# Patient Record
Sex: Male | Born: 1950
Health system: Southern US, Community
[De-identification: ages and names within clinical notes are randomized; demographics above are authoritative.]

## PROBLEM LIST (undated history)

## (undated) DIAGNOSIS — G4730 Sleep apnea, unspecified: Secondary | ICD-10-CM

## (undated) DIAGNOSIS — Z85828 Personal history of other malignant neoplasm of skin: Secondary | ICD-10-CM

## (undated) DIAGNOSIS — Z8719 Personal history of other diseases of the digestive system: Secondary | ICD-10-CM

## (undated) DIAGNOSIS — E785 Hyperlipidemia, unspecified: Secondary | ICD-10-CM

## (undated) DIAGNOSIS — T8859XA Other complications of anesthesia, initial encounter: Secondary | ICD-10-CM

## (undated) DIAGNOSIS — M549 Dorsalgia, unspecified: Secondary | ICD-10-CM

## (undated) DIAGNOSIS — T4145XA Adverse effect of unspecified anesthetic, initial encounter: Secondary | ICD-10-CM

## (undated) DIAGNOSIS — L409 Psoriasis, unspecified: Secondary | ICD-10-CM

## (undated) DIAGNOSIS — Z8711 Personal history of peptic ulcer disease: Secondary | ICD-10-CM

## (undated) DIAGNOSIS — K5792 Diverticulitis of intestine, part unspecified, without perforation or abscess without bleeding: Secondary | ICD-10-CM

## (undated) DIAGNOSIS — K219 Gastro-esophageal reflux disease without esophagitis: Secondary | ICD-10-CM

## (undated) DIAGNOSIS — C801 Malignant (primary) neoplasm, unspecified: Secondary | ICD-10-CM

## (undated) DIAGNOSIS — Z8739 Personal history of other diseases of the musculoskeletal system and connective tissue: Secondary | ICD-10-CM

## (undated) DIAGNOSIS — H8109 Meniere's disease, unspecified ear: Secondary | ICD-10-CM

## (undated) HISTORY — PX: SKIN CANCER EXCISION: SHX779

---

## 1972-03-30 HISTORY — PX: FRACTURE SURGERY: SHX138

## 2001-03-30 HISTORY — PX: HERNIA REPAIR: SHX51

## 2001-05-16 ENCOUNTER — Ambulatory Visit (HOSPITAL_BASED_OUTPATIENT_CLINIC_OR_DEPARTMENT_OTHER): Admission: RE | Admit: 2001-05-16 | Discharge: 2001-05-16 | Payer: Self-pay | Admitting: Family Medicine

## 2003-03-31 HISTORY — PX: COLON SURGERY: SHX602

## 2003-11-16 ENCOUNTER — Ambulatory Visit (HOSPITAL_COMMUNITY): Admission: RE | Admit: 2003-11-16 | Discharge: 2003-11-16 | Payer: Self-pay | Admitting: Orthopedic Surgery

## 2003-11-26 ENCOUNTER — Emergency Department (HOSPITAL_COMMUNITY): Admission: EM | Admit: 2003-11-26 | Discharge: 2003-11-26 | Payer: Self-pay | Admitting: Family Medicine

## 2003-11-29 ENCOUNTER — Ambulatory Visit (HOSPITAL_COMMUNITY): Admission: RE | Admit: 2003-11-29 | Discharge: 2003-11-29 | Payer: Self-pay | Admitting: Unknown Physician Specialty

## 2004-11-24 ENCOUNTER — Encounter: Admission: RE | Admit: 2004-11-24 | Discharge: 2005-01-12 | Payer: Self-pay | Admitting: Otolaryngology

## 2005-11-06 ENCOUNTER — Encounter: Admission: RE | Admit: 2005-11-06 | Discharge: 2005-11-06 | Payer: Self-pay | Admitting: Family Medicine

## 2013-09-06 ENCOUNTER — Other Ambulatory Visit: Payer: Self-pay | Admitting: Orthopedic Surgery

## 2013-09-06 DIAGNOSIS — M4800 Spinal stenosis, site unspecified: Secondary | ICD-10-CM

## 2013-09-14 ENCOUNTER — Other Ambulatory Visit: Payer: Self-pay | Admitting: Orthopedic Surgery

## 2013-09-14 ENCOUNTER — Ambulatory Visit
Admission: RE | Admit: 2013-09-14 | Discharge: 2013-09-14 | Disposition: A | Discharge status: Home / Self Care | Payer: 59 | Source: Ambulatory Visit | Attending: Orthopedic Surgery | Admitting: Orthopedic Surgery

## 2013-09-14 ENCOUNTER — Ambulatory Visit
Admission: RE | Admit: 2013-09-14 | Discharge: 2013-09-14 | Disposition: A | Discharge status: Home / Self Care | Payer: Self-pay | Source: Ambulatory Visit | Attending: Orthopedic Surgery | Admitting: Orthopedic Surgery

## 2013-09-14 VITALS — BP 119/60 | HR 63

## 2013-09-14 DIAGNOSIS — M5126 Other intervertebral disc displacement, lumbar region: Secondary | ICD-10-CM

## 2013-09-14 DIAGNOSIS — M4800 Spinal stenosis, site unspecified: Secondary | ICD-10-CM

## 2013-09-14 MED ORDER — MEPERIDINE HCL 100 MG/ML IJ SOLN
75.0000 mg | Freq: Once | INTRAMUSCULAR | Status: AC
  Administered 2013-09-14: 75 mg via INTRAMUSCULAR

## 2013-09-14 MED ORDER — ONDANSETRON HCL 4 MG/2ML IJ SOLN
4.0000 mg | Freq: Once | INTRAMUSCULAR | Status: AC
  Administered 2013-09-14: 4 mg via INTRAMUSCULAR

## 2013-09-14 MED ORDER — IOHEXOL 180 MG/ML  SOLN
15.0000 mL | Freq: Once | INTRAMUSCULAR | Status: AC | PRN
  Administered 2013-09-14: 15 mL via INTRATHECAL

## 2013-09-14 MED ORDER — DIAZEPAM 5 MG PO TABS
10.0000 mg | ORAL_TABLET | Freq: Once | ORAL | Status: AC
  Administered 2013-09-14: 10 mg via ORAL

## 2013-09-14 NOTE — Discharge Instructions (Signed)

## 2013-09-21 NOTE — Progress Notes (Signed)
Need orders please -pt coming for preop MON 09/25/13 - thank you 

## 2013-09-22 ENCOUNTER — Encounter (HOSPITAL_COMMUNITY): Payer: Self-pay | Admitting: Pharmacy Technician

## 2013-09-25 ENCOUNTER — Encounter (HOSPITAL_COMMUNITY): Payer: Self-pay

## 2013-09-25 ENCOUNTER — Encounter (HOSPITAL_COMMUNITY)
Admission: RE | Admit: 2013-09-25 | Discharge: 2013-09-25 | Disposition: A | Discharge status: Home / Self Care | Payer: 59 | Source: Ambulatory Visit | Attending: Orthopedic Surgery | Admitting: Orthopedic Surgery

## 2013-09-25 DIAGNOSIS — Z01812 Encounter for preprocedural laboratory examination: Secondary | ICD-10-CM | POA: Insufficient documentation

## 2013-09-25 HISTORY — DX: Malignant (primary) neoplasm, unspecified: C80.1

## 2013-09-25 HISTORY — DX: Sleep apnea, unspecified: G47.30

## 2013-09-25 HISTORY — DX: Other complications of anesthesia, initial encounter: T88.59XA

## 2013-09-25 HISTORY — DX: Personal history of other malignant neoplasm of skin: Z85.828

## 2013-09-25 HISTORY — DX: Gastro-esophageal reflux disease without esophagitis: K21.9

## 2013-09-25 HISTORY — DX: Personal history of other diseases of the digestive system: Z87.19

## 2013-09-25 HISTORY — DX: Dorsalgia, unspecified: M54.9

## 2013-09-25 HISTORY — DX: Adverse effect of unspecified anesthetic, initial encounter: T41.45XA

## 2013-09-25 HISTORY — DX: Diverticulitis of intestine, part unspecified, without perforation or abscess without bleeding: K57.92

## 2013-09-25 HISTORY — DX: Personal history of other diseases of the musculoskeletal system and connective tissue: Z87.39

## 2013-09-25 HISTORY — DX: Psoriasis, unspecified: L40.9

## 2013-09-25 HISTORY — DX: Personal history of peptic ulcer disease: Z87.11

## 2013-09-25 HISTORY — DX: Meniere's disease, unspecified ear: H81.09

## 2013-09-25 HISTORY — DX: Hyperlipidemia, unspecified: E78.5

## 2013-09-25 LAB — CBC
HCT: 43.4 % (ref 39.0–52.0)
Hemoglobin: 15.2 g/dL (ref 13.0–17.0)
MCH: 32.5 pg (ref 26.0–34.0)
MCHC: 35 g/dL (ref 30.0–36.0)
MCV: 92.7 fL (ref 78.0–100.0)
Platelets: 233 10*3/uL (ref 150–400)
RBC: 4.68 MIL/uL (ref 4.22–5.81)
RDW: 12 % (ref 11.5–15.5)
WBC: 6 10*3/uL (ref 4.0–10.5)

## 2013-09-25 LAB — SURGICAL PCR SCREEN
MRSA, PCR: NEGATIVE
STAPHYLOCOCCUS AUREUS: POSITIVE — AB

## 2013-09-25 LAB — BASIC METABOLIC PANEL
BUN: 17 mg/dL (ref 6–23)
CO2: 26 meq/L (ref 19–32)
CREATININE: 1.09 mg/dL (ref 0.50–1.35)
Calcium: 9.3 mg/dL (ref 8.4–10.5)
Chloride: 99 mEq/L (ref 96–112)
GFR, EST AFRICAN AMERICAN: 83 mL/min — AB (ref >90)
GFR, EST NON AFRICAN AMERICAN: 72 mL/min — AB (ref >90)
GLUCOSE: 96 mg/dL (ref 70–99)
Potassium: 4.4 mEq/L (ref 3.7–5.3)
SODIUM: 139 meq/L (ref 137–147)

## 2013-09-25 NOTE — Patient Instructions (Addendum)
Kyle Booker  09/25/2013                           YOUR PROCEDURE IS SCHEDULED ON: 10/04/13 AT 10:00 AM               ENTER [facility]TR[facility] AND                            FOLLOW  SIGNS TO SHORT STAY CENTER                 ARRIVE AT SHORT STAY AT:  8:00 AM               CALL THIS NUMBER IF ANY PROBLEMS THE DAY OF SURGERY :               [phone]                                REMEMBER:   Do not eat food or drink liquids AFTER MIDNIGHT               Take these medicines the morning of surgery with               A SIPS OF WATER :   PRILOSEC             BRING C PAP [place]      Do not wear jewelry, make-up   Do not wear lotions, powders, or perfumes.   Do not shave legs or underarms 12 hrs. before surgery (men may shave face)  Do not bring valuables to the hospital.  Contacts, dentures or bridgework may not be worn into surgery.  Leave suitcase in the car. After surgery it may be brought to your room.  For patients admitted to the hospital more than one night, checkout time is            11:00 AM                                                       The day of discharge.   Patients discharged the day of surgery will not be allowed to drive home.            If going home same day of surgery, must have someone stay with you              FIRST 24 hrs at home and arrange for some one to drive you              home from hospital.   ________________________________________________________________________                                                                        [facility]  Before surgery, you can play an important role.  Because skin is not sterile, your skin needs  to be as free of germs as possible.  You can reduce the number of germs on your skin by washing with CHG (chlorahexidine gluconate) soap before surgery.  CHG is an antiseptic cleaner which kills germs and bonds with the  skin to continue killing germs even after washing. Please DO NOT use if you have an allergy to CHG or antibacterial soaps.  If your skin becomes reddened/irritated stop using the CHG and inform your nurse when you arrive at Short Stay. Do not shave (including legs and underarms) for at least 48 hours prior to the first CHG shower.  You may shave your face. Please follow these instructions carefully:   1.  Shower with CHG Soap the night before surgery and the  morning of Surgery.   2.  If you choose to wash your hair, wash your hair first as usual with your  normal  Shampoo.   3.  After you shampoo, rinse your hair and body thoroughly to remove the  shampoo.                                         4.  Use CHG as you would any other liquid soap.  You can apply chg directly  to the skin and wash . Gently wash with scrungie or clean wascloth    5.  Apply the CHG Soap to your body ONLY FROM THE NECK DOWN.   Do not use on open                           Wound or open sores. Avoid contact with eyes, ears mouth and genitals (private parts).                        Genitals (private parts) with your normal soap.              6.  Wash thoroughly, paying special attention to the area where your surgery  will be performed.   7.  Thoroughly rinse your body with warm water from the neck down.   8.  DO NOT shower/wash with your normal soap after using and rinsing off  the CHG Soap .                9.  Pat yourself dry with a clean towel.             10.  Wear clean pajamas.             11.  Place clean sheets on your bed the night of your first shower and do not  sleep with pets.  Day of Surgery : Do not apply any lotions/deodorants the morning of surgery.  Please wear clean clothes to the hospital/surgery center.  FAILURE TO FOLLOW THESE INSTRUCTIONS MAY RESULT IN THE CANCELLATION OF YOUR SURGERY    PATIENT  SIGNATURE_________________________________  ______________________________________________________________________     Adam Phenix  An incentive spirometer is a tool that can help keep your lungs clear and active. This tool measures how well you are filling your lungs with each breath. Taking long deep breaths may help reverse or decrease the chance of developing breathing (pulmonary) problems (especially infection) following:  A long period of time when you are unable to move or be active. BEFORE THE PROCEDURE   If the spirometer includes  an indicator to show your best effort, your nurse or respiratory therapist will set it to a desired goal.  If possible, sit up straight or lean slightly forward. Try not to slouch.  Hold the incentive spirometer in an upright position. INSTRUCTIONS FOR USE  1. Sit on the edge of your bed if possible, or sit up as far as you can in bed or on a chair. 2. Hold the incentive spirometer in an upright position. 3. Breathe out normally. 4. Place the mouthpiece in your mouth and seal your lips tightly around it. 5. Breathe in slowly and as deeply as possible, raising the piston or the ball toward the top of the column. 6. Hold your breath for 3-5 seconds or for as long as possible. Allow the piston or ball to fall to the bottom of the column. 7. Remove the mouthpiece from your mouth and breathe out normally. 8. Rest for a few seconds and repeat Steps 1 through 7 at least 10 times every 1-2 hours when you are awake. Take your time and take a few normal breaths between deep breaths. 9. The spirometer may include an indicator to show your best effort. Use the indicator as a goal to work toward during each repetition. 10. After each set of 10 deep breaths, practice coughing to be sure your lungs are clear. If you have an incision (the cut made at the time of surgery), support your incision when coughing by placing a pillow or rolled up towels firmly  against it. Once you are able to get out of bed, walk around indoors and cough well. You may stop using the incentive spirometer when instructed by your caregiver.  RISKS AND COMPLICATIONS  Take your time so you do not get dizzy or light-headed.  If you are in pain, you may need to take or ask for pain medication before doing incentive spirometry. It is harder to take a deep breath if you are having pain. AFTER USE  Rest and breathe slowly and easily.  It can be helpful to keep track of a log of your progress. Your caregiver can provide you with a simple table to help with this. If you are using the spirometer at home, follow these instructions: [place] IF:   You are having difficultly using the spirometer.  You have trouble using the spirometer as often as instructed.  Your pain medication is not giving enough relief while using the spirometer.  You develop fever of 100.5 F (38.1 C) or higher. SEEK IMMEDIATE MEDICAL CARE IF:   You cough up bloody sputum that had not been present before.  You develop fever of 102 F (38.9 C) or greater.  You develop worsening pain at or near the incision site. MAKE SURE YOU:   Understand these instructions.  Will watch your condition.  Will get help right away if you are not doing well or get worse. Document Released: 07/27/2006 Document Revised: 06/08/2011 Document Reviewed: 09/27/2006 [facility] Patient Information 2014 [place], [place].   ________________________________________________________________________

## 2013-09-25 NOTE — Progress Notes (Signed)
Pt came for preop 09/25/13 - no orders in EPIC - CBC / BMET / PCR done per anesthesia criteria

## 2013-09-26 ENCOUNTER — Other Ambulatory Visit: Payer: Self-pay | Admitting: Surgical

## 2013-09-27 NOTE — Progress Notes (Signed)
Pt contacted to arrive at 7:00 am instead of 8:00 am since additional studies have been ordered.

## 2013-09-28 NOTE — H&P (Signed)
Kyle Booker is an 63 y.o. male.   Chief Complaint: low back pain HPI: *The patient is a 63 year old male who presents with the chief complaint of low back pain. He is also having radiation of pain in the right LE. He has been having this for 2-3 months with no specific injury. In addition he started to develop weakness in the right leg and foot. He failed conservative treatment including rest, NSAIDs, and prednisone. 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   Past Medical History  Diagnosis Date  . Complication of anesthesia     slight nausea in past   . Hyperlipidemia   . Meniere's disease     takes dyazide for this problem - no BP problems  . Back pain   . History of gout   . History of nonmelanoma skin cancer   . Psoriasis   . GERD (gastroesophageal reflux disease)   . Diverticulitis   . History of stomach ulcers   . Sleep apnea     uses c pap  . Cancer     basal cell skin cancer    Past Surgical History  Procedure Laterality Date  . Colon surgery  2005    resection for diverticulitis  . Hernia repair  2003  . Fracture surgery  1974    fracgtures femur pinning  . Skin cancer excision      Social History:  reports that he quit smoking about 10 years ago. He has never used smokeless tobacco. He reports that he does not drink alcohol or use illicit drugs.  Allergies:  Allergies  Allergen Reactions  . Sudafed [Pseudoephedrine Hcl] Other (See Comments)    Makes him hyper     Current outpatient prescriptions: cholecalciferol (VITAMIN D) 1000 UNITS tablet, Take 1,000 Units by mouth daily., Disp: , Rfl: ;   Coenzyme Q10 (CO Q 10 PO), Take 1 capsule by mouth daily., Disp: , Rfl: ;   Cyanocobalamin (VITAMIN B 12 PO), Take 1,000 mg by mouth daily., Disp: , Rfl: ;   diphenhydrAMINE (BENADRYL) spray, Apply 1 spray topically every 4 (four) hours as needed for itching., Disp: , Rfl:  dutasteride (AVODART) 0.5 MG capsule, Take 0.5 mg by mouth every  other day., Disp: , Rfl: ;   ibuprofen (ADVIL,MOTRIN) 200 MG tablet, Take 800 mg by mouth every 6 (six) hours as needed for moderate pain., Disp: , Rfl: ;   Magnesium 500 MG CAPS, Take 500 mg by mouth 2 (two) times daily., Disp: , Rfl: ;   Multiple Vitamins-Minerals (ALIVE MENS ENERGY PO), Take 1 tablet by mouth daily., Disp: , Rfl:  OIL OF OREGANO PO, Take 1 capsule by mouth 2 (two) times daily., Disp: , Rfl: ;   omega-3 acid ethyl esters (LOVAZA) 1 G capsule, Take 1 g by mouth 2 (two) times daily., Disp: , Rfl: ;   omeprazole (PRILOSEC) 20 MG capsule, Take 20 mg by mouth daily., Disp: , Rfl: ;   Red Yeast Rice Extract (RED YEAST RICE PO), Take 1 capsule by mouth 2 (two) times daily., Disp: , Rfl:  rosuvastatin (CRESTOR) 10 MG tablet, Take 10 mg by mouth 2 (two) times a week. Monday and Thursday, Disp: , Rfl: ;   Saw Palmetto, Serenoa repens, 320 MG CAPS, Take 320 mg by mouth 2 (two) times daily., Disp: , Rfl: ;   triamterene-hydrochlorothiazide (DYAZIDE) 37.5-25 MG per capsule, Take 1 capsule by mouth every morning., Disp: , Rfl:  Review of Systems  Constitutional: Positive for malaise/fatigue. Negative for fever, chills, weight loss and diaphoresis.  HENT: Negative.   Eyes: Negative.   Respiratory: Negative.   Cardiovascular: Negative.   Gastrointestinal: Negative.   Genitourinary: Negative.   Musculoskeletal: Positive for back pain and myalgias. Negative for falls, joint pain and neck pain.  Skin: Positive for itching. Negative for rash.  Neurological: Positive for sensory change and weakness. Negative for dizziness, tingling, tremors, speech change, seizures and loss of consciousness.  Endo/Heme/Allergies: Negative.   Psychiatric/Behavioral: Negative.   Physical Exam  Constitutional: He is oriented to person, place, and time. He appears well-developed and well-nourished. No distress.  HENT:  Head: Normocephalic and atraumatic.  Right Ear: External ear normal.  Left Ear: External  ear normal.  Nose: Nose normal.  Mouth/Throat: Oropharynx is clear and moist.  Eyes: Conjunctivae and EOM are normal.  Neck: Normal range of motion. Neck supple. No tracheal deviation present. No thyromegaly present.  Cardiovascular: Normal rate, regular rhythm, normal heart sounds and intact distal pulses.   No murmur heard. Respiratory: Effort normal and breath sounds normal. No respiratory distress. He has no wheezes.  GI: Soft. Bowel sounds are normal. He exhibits no distension. There is no tenderness.  Musculoskeletal:       Right hip: Normal.       Left hip: Normal.       Right knee: Normal.       Left knee: Normal.       Lumbar back: He exhibits decreased range of motion, tenderness, pain and spasm.       Right lower leg: He exhibits no tenderness and no swelling.       Left lower leg: He exhibits no tenderness and no swelling.  Pain with motion of the lumbar spine with radicular pain into the right LE. Weak dorsiflexors and toe extensors on the right. Positive SLR on the right.   Neurological: He is alert and oriented to person, place, and time. He has normal reflexes. No sensory deficit.  Skin: No rash noted. He is not diaphoretic. No erythema.  Psychiatric: He has a normal mood and affect. His behavior is normal.     Assessment/Plan Lumbar disc herniation L4-L5 right He needs a lumbar hemilaminectomy and microdiscectomy L4-L5 on the right. The possible complications of spinal surgery number one could be infection, which is extremely rare. We do use antibiotics prior to the surgery and during surgery and after surgery. Number two is always a slight degree of probability that you could develop a blood clot in your leg after any type of surgery and we try our best to prevent that with aspirin post op when it is safe to begin. The third is a dural leak. That is the spinal fluid leak that could occur. At certain rare times the bone or the disc could literally stick to the dura which is  the lining which contains the spinal fluid and we could develop a small tear in that lining which we then patch up. That is an extremely rare complication. The last and final complication is a recurrent disc rupture. That means that you could rupture another small piece of disc later on down the road and there is about a 2% chance of that.   Dionisia Pacholski LAUREN 09/28/2013, 12:36 PM

## 2013-10-04 ENCOUNTER — Ambulatory Visit (HOSPITAL_COMMUNITY): Payer: 59

## 2013-10-04 ENCOUNTER — Ambulatory Visit (HOSPITAL_COMMUNITY): Payer: 59 | Admitting: Certified Registered Nurse Anesthetist

## 2013-10-04 ENCOUNTER — Encounter (HOSPITAL_COMMUNITY): Payer: Self-pay | Admitting: Certified Registered Nurse Anesthetist

## 2013-10-04 ENCOUNTER — Encounter (HOSPITAL_COMMUNITY)
Admission: RE | Disposition: A | Discharge status: Home / Self Care | Payer: Self-pay | Source: Ambulatory Visit | Attending: Orthopedic Surgery

## 2013-10-04 ENCOUNTER — Observation Stay (HOSPITAL_COMMUNITY)
Admission: RE | Admit: 2013-10-04 | Discharge: 2013-10-05 | Disposition: A | Discharge status: Home / Self Care | Payer: 59 | Source: Ambulatory Visit | Attending: Orthopedic Surgery | Admitting: Orthopedic Surgery

## 2013-10-04 ENCOUNTER — Encounter (HOSPITAL_COMMUNITY): Payer: 59 | Admitting: Certified Registered Nurse Anesthetist

## 2013-10-04 DIAGNOSIS — Z87891 Personal history of nicotine dependence: Secondary | ICD-10-CM | POA: Insufficient documentation

## 2013-10-04 DIAGNOSIS — M5126 Other intervertebral disc displacement, lumbar region: Principal | ICD-10-CM | POA: Insufficient documentation

## 2013-10-04 DIAGNOSIS — G4730 Sleep apnea, unspecified: Secondary | ICD-10-CM | POA: Insufficient documentation

## 2013-10-04 DIAGNOSIS — Z79899 Other long term (current) drug therapy: Secondary | ICD-10-CM | POA: Insufficient documentation

## 2013-10-04 DIAGNOSIS — K219 Gastro-esophageal reflux disease without esophagitis: Secondary | ICD-10-CM | POA: Insufficient documentation

## 2013-10-04 DIAGNOSIS — Z85828 Personal history of other malignant neoplasm of skin: Secondary | ICD-10-CM | POA: Insufficient documentation

## 2013-10-04 DIAGNOSIS — M48062 Spinal stenosis, lumbar region with neurogenic claudication: Secondary | ICD-10-CM

## 2013-10-04 DIAGNOSIS — M5137 Other intervertebral disc degeneration, lumbosacral region: Secondary | ICD-10-CM | POA: Insufficient documentation

## 2013-10-04 DIAGNOSIS — H8109 Meniere's disease, unspecified ear: Secondary | ICD-10-CM | POA: Insufficient documentation

## 2013-10-04 DIAGNOSIS — E785 Hyperlipidemia, unspecified: Secondary | ICD-10-CM | POA: Insufficient documentation

## 2013-10-04 DIAGNOSIS — M51379 Other intervertebral disc degeneration, lumbosacral region without mention of lumbar back pain or lower extremity pain: Secondary | ICD-10-CM | POA: Insufficient documentation

## 2013-10-04 DIAGNOSIS — M109 Gout, unspecified: Secondary | ICD-10-CM | POA: Insufficient documentation

## 2013-10-04 HISTORY — PX: LUMBAR LAMINECTOMY/DECOMPRESSION MICRODISCECTOMY: SHX5026

## 2013-10-04 LAB — COMPREHENSIVE METABOLIC PANEL
ALT: 34 U/L (ref 0–53)
AST: 25 U/L (ref 0–37)
Albumin: 4 g/dL (ref 3.5–5.2)
Alkaline Phosphatase: 61 U/L (ref 39–117)
Anion gap: 11 (ref 5–15)
BUN: 14 mg/dL (ref 6–23)
CO2: 26 mEq/L (ref 19–32)
Calcium: 9.2 mg/dL (ref 8.4–10.5)
Chloride: 100 mEq/L (ref 96–112)
Creatinine, Ser: 1.01 mg/dL (ref 0.50–1.35)
GFR calc Af Amer: >90 mL/min (ref >90)
GFR calc non Af Amer: 78 mL/min — ABNORMAL LOW (ref >90)
Glucose, Bld: 113 mg/dL — ABNORMAL HIGH (ref 70–99)
Potassium: 4.2 mEq/L (ref 3.7–5.3)
Sodium: 137 mEq/L (ref 137–147)
Total Bilirubin: 0.5 mg/dL (ref 0.3–1.2)
Total Protein: 7.2 g/dL (ref 6.0–8.3)

## 2013-10-04 LAB — URINALYSIS, ROUTINE W REFLEX MICROSCOPIC
Bilirubin Urine: NEGATIVE
Glucose, UA: NEGATIVE mg/dL
Hgb urine dipstick: NEGATIVE
Ketones, ur: NEGATIVE mg/dL
Leukocytes, UA: NEGATIVE
Nitrite: NEGATIVE
Protein, ur: NEGATIVE mg/dL
Specific Gravity, Urine: 1.019 (ref 1.005–1.030)
Urobilinogen, UA: 0.2 mg/dL (ref 0.0–1.0)
pH: 7 (ref 5.0–8.0)

## 2013-10-04 LAB — TYPE AND SCREEN
ABO/RH(D): A POS
Antibody Screen: NEGATIVE

## 2013-10-04 LAB — PROTIME-INR
INR: 1.02 (ref 0.00–1.49)
Prothrombin Time: 13.4 seconds (ref 11.6–15.2)

## 2013-10-04 LAB — APTT: aPTT: 38 seconds — ABNORMAL HIGH (ref 24–37)

## 2013-10-04 LAB — ABO/RH: ABO/RH(D): A POS

## 2013-10-04 SURGERY — LUMBAR LAMINECTOMY/DECOMPRESSION MICRODISCECTOMY 1 LEVEL
Anesthesia: General | Site: Back | Laterality: Right

## 2013-10-04 MED ORDER — EPHEDRINE SULFATE 50 MG/ML IJ SOLN
INTRAMUSCULAR | Status: DC | PRN
  Administered 2013-10-04 (×3): 5 mg via INTRAVENOUS

## 2013-10-04 MED ORDER — METHOCARBAMOL 500 MG PO TABS
500.0000 mg | ORAL_TABLET | Freq: Four times a day (QID) | ORAL | Status: DC | PRN
  Administered 2013-10-04 – 2013-10-05 (×2): 500 mg via ORAL
  Filled 2013-10-04 (×2): qty 1

## 2013-10-04 MED ORDER — PHENYLEPHRINE HCL 10 MG/ML IJ SOLN
INTRAMUSCULAR | Status: AC
  Filled 2013-10-04: qty 1

## 2013-10-04 MED ORDER — LIDOCAINE HCL (CARDIAC) 20 MG/ML IV SOLN
INTRAVENOUS | Status: DC | PRN
  Administered 2013-10-04: 50 mg via INTRAVENOUS

## 2013-10-04 MED ORDER — CISATRACURIUM BESYLATE 20 MG/10ML IV SOLN
INTRAVENOUS | Status: AC
  Filled 2013-10-04: qty 10

## 2013-10-04 MED ORDER — LIDOCAINE HCL (CARDIAC) 20 MG/ML IV SOLN
INTRAVENOUS | Status: AC
  Filled 2013-10-04: qty 5

## 2013-10-04 MED ORDER — PHENOL 1.4 % MT LIQD
1.0000 | OROMUCOSAL | Status: DC | PRN
  Filled 2013-10-04: qty 177

## 2013-10-04 MED ORDER — GLYCOPYRROLATE 0.2 MG/ML IJ SOLN
INTRAMUSCULAR | Status: DC | PRN
  Administered 2013-10-04: .4 mg via INTRAVENOUS

## 2013-10-04 MED ORDER — DEXAMETHASONE SODIUM PHOSPHATE 10 MG/ML IJ SOLN
INTRAMUSCULAR | Status: DC | PRN
  Administered 2013-10-04: 10 mg via INTRAVENOUS

## 2013-10-04 MED ORDER — BACITRACIN-NEOMYCIN-POLYMYXIN 400-5-5000 EX OINT
TOPICAL_OINTMENT | CUTANEOUS | Status: DC | PRN
  Administered 2013-10-04: 1 via TOPICAL

## 2013-10-04 MED ORDER — HYDROMORPHONE HCL PF 1 MG/ML IJ SOLN
0.5000 mg | INTRAMUSCULAR | Status: DC | PRN
  Administered 2013-10-04: 1 mg via INTRAVENOUS
  Filled 2013-10-04: qty 1

## 2013-10-04 MED ORDER — THROMBIN 5000 UNITS EX SOLR
CUTANEOUS | Status: AC
  Filled 2013-10-04: qty 10000

## 2013-10-04 MED ORDER — HYDROCODONE-ACETAMINOPHEN 5-325 MG PO TABS: 1.0000 | ORAL_TABLET | ORAL | Status: DC | PRN

## 2013-10-04 MED ORDER — MIDAZOLAM HCL 5 MG/5ML IJ SOLN
INTRAMUSCULAR | Status: DC | PRN
  Administered 2013-10-04 (×2): 1 mg via INTRAVENOUS

## 2013-10-04 MED ORDER — LACTATED RINGERS IV SOLN
INTRAVENOUS | Status: DC
  Administered 2013-10-04 (×2): via INTRAVENOUS

## 2013-10-04 MED ORDER — CISATRACURIUM BESYLATE (PF) 10 MG/5ML IV SOLN
INTRAVENOUS | Status: DC | PRN
  Administered 2013-10-04: 2 mg via INTRAVENOUS
  Administered 2013-10-04: 8 mg via INTRAVENOUS
  Administered 2013-10-04: 2 mg via INTRAVENOUS

## 2013-10-04 MED ORDER — PROPOFOL 10 MG/ML IV BOLUS
INTRAVENOUS | Status: DC | PRN
  Administered 2013-10-04: 180 mg via INTRAVENOUS

## 2013-10-04 MED ORDER — DEXAMETHASONE SODIUM PHOSPHATE 10 MG/ML IJ SOLN
INTRAMUSCULAR | Status: AC
  Filled 2013-10-04: qty 1

## 2013-10-04 MED ORDER — BUPIVACAINE LIPOSOME 1.3 % IJ SUSP
20.0000 mL | Freq: Once | INTRAMUSCULAR | Status: DC
  Filled 2013-10-04: qty 20

## 2013-10-04 MED ORDER — LACTATED RINGERS IV SOLN: INTRAVENOUS | Status: DC

## 2013-10-04 MED ORDER — ONDANSETRON HCL 4 MG/2ML IJ SOLN: 4.0000 mg | INTRAMUSCULAR | Status: DC | PRN

## 2013-10-04 MED ORDER — POLYMYXIN B SULFATE 500000 UNITS IJ SOLR
INTRAMUSCULAR | Status: DC | PRN
  Administered 2013-10-04: 12:00:00

## 2013-10-04 MED ORDER — METOCLOPRAMIDE HCL 5 MG/ML IJ SOLN
INTRAMUSCULAR | Status: AC
  Filled 2013-10-04: qty 2

## 2013-10-04 MED ORDER — BUPIVACAINE-EPINEPHRINE 0.5% -1:200000 IJ SOLN
INTRAMUSCULAR | Status: AC
  Filled 2013-10-04: qty 1

## 2013-10-04 MED ORDER — FENTANYL CITRATE 0.05 MG/ML IJ SOLN
INTRAMUSCULAR | Status: AC
  Filled 2013-10-04: qty 5

## 2013-10-04 MED ORDER — SODIUM CHLORIDE 0.9 % IJ SOLN
INTRAMUSCULAR | Status: AC
  Filled 2013-10-04: qty 10

## 2013-10-04 MED ORDER — PROMETHAZINE HCL 25 MG/ML IJ SOLN
INTRAMUSCULAR | Status: AC
  Filled 2013-10-04: qty 1

## 2013-10-04 MED ORDER — ACETAMINOPHEN 325 MG PO TABS: 650.0000 mg | ORAL_TABLET | ORAL | Status: DC | PRN

## 2013-10-04 MED ORDER — ONDANSETRON HCL 4 MG/2ML IJ SOLN
INTRAMUSCULAR | Status: AC
  Filled 2013-10-04: qty 2

## 2013-10-04 MED ORDER — BUPIVACAINE LIPOSOME 1.3 % IJ SUSP
INTRAMUSCULAR | Status: DC | PRN
  Administered 2013-10-04: 20 mL

## 2013-10-04 MED ORDER — PROMETHAZINE HCL 25 MG/ML IJ SOLN
6.2500 mg | INTRAMUSCULAR | Status: DC | PRN
  Administered 2013-10-04: 12.5 mg via INTRAVENOUS

## 2013-10-04 MED ORDER — FENTANYL CITRATE 0.05 MG/ML IJ SOLN
INTRAMUSCULAR | Status: DC | PRN
  Administered 2013-10-04: 50 ug via INTRAVENOUS
  Administered 2013-10-04: 100 ug via INTRAVENOUS
  Administered 2013-10-04 (×4): 50 ug via INTRAVENOUS

## 2013-10-04 MED ORDER — THROMBIN 5000 UNITS EX SOLR
CUTANEOUS | Status: DC | PRN
  Administered 2013-10-04: 10000 [IU] via TOPICAL

## 2013-10-04 MED ORDER — TRIAMTERENE-HCTZ 37.5-25 MG PO CAPS
1.0000 | ORAL_CAPSULE | Freq: Every morning | ORAL | Status: DC
  Filled 2013-10-04 (×2): qty 1

## 2013-10-04 MED ORDER — NEOSTIGMINE METHYLSULFATE 10 MG/10ML IV SOLN
INTRAVENOUS | Status: AC
  Filled 2013-10-04: qty 1

## 2013-10-04 MED ORDER — POLYETHYLENE GLYCOL 3350 17 G PO PACK: 17.0000 g | PACK | Freq: Every day | ORAL | Status: DC | PRN

## 2013-10-04 MED ORDER — CEFAZOLIN SODIUM-DEXTROSE 2-3 GM-% IV SOLR
INTRAVENOUS | Status: AC
  Filled 2013-10-04: qty 50

## 2013-10-04 MED ORDER — MIDAZOLAM HCL 2 MG/2ML IJ SOLN
INTRAMUSCULAR | Status: AC
  Filled 2013-10-04: qty 2

## 2013-10-04 MED ORDER — ONDANSETRON HCL 4 MG/2ML IJ SOLN
INTRAMUSCULAR | Status: DC | PRN
  Administered 2013-10-04 (×2): 2 mg via INTRAVENOUS

## 2013-10-04 MED ORDER — HYDROMORPHONE HCL PF 1 MG/ML IJ SOLN: 0.2500 mg | INTRAMUSCULAR | Status: DC | PRN

## 2013-10-04 MED ORDER — METHOCARBAMOL 1000 MG/10ML IJ SOLN
500.0000 mg | Freq: Four times a day (QID) | INTRAVENOUS | Status: DC | PRN
  Administered 2013-10-04: 500 mg via INTRAVENOUS
  Filled 2013-10-04: qty 5

## 2013-10-04 MED ORDER — BACITRACIN ZINC 500 UNIT/GM EX OINT
TOPICAL_OINTMENT | CUTANEOUS | Status: AC
  Filled 2013-10-04: qty 28.35

## 2013-10-04 MED ORDER — PANTOPRAZOLE SODIUM 40 MG PO TBEC
40.0000 mg | DELAYED_RELEASE_TABLET | Freq: Every day | ORAL | Status: DC
  Administered 2013-10-05: 40 mg via ORAL
  Filled 2013-10-04: qty 1

## 2013-10-04 MED ORDER — CEFAZOLIN SODIUM 1-5 GM-% IV SOLN
1.0000 g | Freq: Three times a day (TID) | INTRAVENOUS | Status: AC
  Administered 2013-10-04 – 2013-10-05 (×3): 1 g via INTRAVENOUS
  Filled 2013-10-04 (×3): qty 50

## 2013-10-04 MED ORDER — OXYCODONE-ACETAMINOPHEN 5-325 MG PO TABS
1.0000 | ORAL_TABLET | ORAL | Status: DC | PRN
Start: 2013-10-04 — End: 2013-10-05
  Administered 2013-10-04 – 2013-10-05 (×2): 2 via ORAL
  Filled 2013-10-04 (×2): qty 2

## 2013-10-04 MED ORDER — EPHEDRINE SULFATE 50 MG/ML IJ SOLN
INTRAMUSCULAR | Status: AC
  Filled 2013-10-04: qty 1

## 2013-10-04 MED ORDER — ATROPINE SULFATE 0.4 MG/ML IJ SOLN
INTRAMUSCULAR | Status: AC
  Filled 2013-10-04: qty 1

## 2013-10-04 MED ORDER — SUCCINYLCHOLINE CHLORIDE 20 MG/ML IJ SOLN
INTRAMUSCULAR | Status: DC | PRN
  Administered 2013-10-04: 140 mg via INTRAVENOUS

## 2013-10-04 MED ORDER — PROPOFOL 10 MG/ML IV BOLUS
INTRAVENOUS | Status: AC
  Filled 2013-10-04: qty 20

## 2013-10-04 MED ORDER — GLYCOPYRROLATE 0.2 MG/ML IJ SOLN
INTRAMUSCULAR | Status: AC
  Filled 2013-10-04: qty 3

## 2013-10-04 MED ORDER — CEFAZOLIN SODIUM-DEXTROSE 2-3 GM-% IV SOLR
2.0000 g | INTRAVENOUS | Status: AC
  Administered 2013-10-04: 2 g via INTRAVENOUS

## 2013-10-04 MED ORDER — ACETAMINOPHEN 10 MG/ML IV SOLN
1000.0000 mg | Freq: Once | INTRAVENOUS | Status: AC
  Administered 2013-10-04: 1000 mg via INTRAVENOUS
  Filled 2013-10-04: qty 100

## 2013-10-04 MED ORDER — MENTHOL 3 MG MT LOZG
1.0000 | LOZENGE | OROMUCOSAL | Status: DC | PRN
  Filled 2013-10-04: qty 9

## 2013-10-04 MED ORDER — LACTATED RINGERS IV SOLN
INTRAVENOUS | Status: DC | PRN
  Administered 2013-10-04 (×2): via INTRAVENOUS

## 2013-10-04 MED ORDER — DUTASTERIDE 0.5 MG PO CAPS
0.5000 mg | ORAL_CAPSULE | ORAL | Status: DC
  Filled 2013-10-04: qty 1

## 2013-10-04 MED ORDER — ACETAMINOPHEN 650 MG RE SUPP: 650.0000 mg | RECTAL | Status: DC | PRN

## 2013-10-04 MED ORDER — NEOSTIGMINE METHYLSULFATE 10 MG/10ML IV SOLN
INTRAVENOUS | Status: DC | PRN
  Administered 2013-10-04: 3 mg via INTRAVENOUS

## 2013-10-04 MED ORDER — ATORVASTATIN CALCIUM 20 MG PO TABS
20.0000 mg | ORAL_TABLET | Freq: Every day | ORAL | Status: DC
  Administered 2013-10-04: 20 mg via ORAL
  Filled 2013-10-04 (×2): qty 1

## 2013-10-04 MED ORDER — LACTATED RINGERS IV SOLN
INTRAVENOUS | Status: DC
  Administered 2013-10-04: 1000 mL via INTRAVENOUS

## 2013-10-04 MED ORDER — BUPIVACAINE-EPINEPHRINE 0.5% -1:200000 IJ SOLN
INTRAMUSCULAR | Status: DC | PRN
  Administered 2013-10-04: 20 mL

## 2013-10-04 SURGICAL SUPPLY — 39 items
BAG ZIPLOCK 12X15 (MISCELLANEOUS) ×3 IMPLANT
CLEANER TIP ELECTROSURG 2X2 (MISCELLANEOUS) ×3 IMPLANT
DRAIN PENROSE 18X1/4 LTX STRL (WOUND CARE) IMPLANT
DRAPE LG THREE QUARTER DISP (DRAPES) ×9 IMPLANT
DRAPE MICROSCOPE LEICA (MISCELLANEOUS) ×3 IMPLANT
DRAPE POUCH INSTRU U-SHP 10X18 (DRAPES) ×3 IMPLANT
DRAPE SURG 17X11 SM STRL (DRAPES) ×3 IMPLANT
DRSG ADAPTIC 3X8 NADH LF (GAUZE/BANDAGES/DRESSINGS) ×3 IMPLANT
DRSG PAD ABDOMINAL 8X10 ST (GAUZE/BANDAGES/DRESSINGS) ×6 IMPLANT
DURAPREP 26ML APPLICATOR (WOUND CARE) ×3 IMPLANT
ELECT BLADE TIP CTD 4 INCH (ELECTRODE) ×3 IMPLANT
ELECT REM PT RETURN 9FT ADLT (ELECTROSURGICAL) ×3
ELECTRODE REM PT RTRN 9FT ADLT (ELECTROSURGICAL) ×1 IMPLANT
GLOVE BIOGEL PI IND STRL 8 (GLOVE) ×1 IMPLANT
GLOVE BIOGEL PI INDICATOR 8 (GLOVE) ×2
GLOVE ECLIPSE 8.0 STRL XLNG CF (GLOVE) ×3 IMPLANT
GOWN STRL REUS W/TWL XL LVL3 (GOWN DISPOSABLE) ×6 IMPLANT
KIT BASIN OR (CUSTOM PROCEDURE TRAY) ×3 IMPLANT
KIT POSITIONING SURG ANDREWS (MISCELLANEOUS) ×3 IMPLANT
MANIFOLD NEPTUNE II (INSTRUMENTS) ×3 IMPLANT
NEEDLE SPNL 18GX3.5 QUINCKE PK (NEEDLE) ×9 IMPLANT
PATTIES SURGICAL .5 X.5 (GAUZE/BANDAGES/DRESSINGS) ×3 IMPLANT
PATTIES SURGICAL .75X.75 (GAUZE/BANDAGES/DRESSINGS) IMPLANT
PATTIES SURGICAL 1X1 (DISPOSABLE) IMPLANT
PIN SAFETY NICK PLATE  2 MED (MISCELLANEOUS)
PIN SAFETY NICK PLATE 2 MED (MISCELLANEOUS) IMPLANT
POSITIONER SURGICAL ARM (MISCELLANEOUS) ×3 IMPLANT
SPONGE GAUZE 4X4 12PLY (GAUZE/BANDAGES/DRESSINGS) ×3 IMPLANT
SPONGE LAP 4X18 X RAY DECT (DISPOSABLE) ×12 IMPLANT
SPONGE SURGIFOAM ABS GEL 100 (HEMOSTASIS) ×3 IMPLANT
STAPLER VISISTAT 35W (STAPLE) IMPLANT
SUT VIC AB 0 CT1 27 (SUTURE) ×2
SUT VIC AB 0 CT1 27XBRD ANTBC (SUTURE) ×1 IMPLANT
SUT VIC AB 1 CT1 27 (SUTURE) ×6
SUT VIC AB 1 CT1 27XBRD ANTBC (SUTURE) ×3 IMPLANT
SYR 20CC LL (SYRINGE) ×3 IMPLANT
TAPE CLOTH SURG 4X10 WHT LF (GAUZE/BANDAGES/DRESSINGS) ×3 IMPLANT
TOWEL OR 17X26 10 PK STRL BLUE (TOWEL DISPOSABLE) ×3 IMPLANT
TRAY LAMINECTOMY (CUSTOM PROCEDURE TRAY) ×3 IMPLANT

## 2013-10-04 NOTE — Anesthesia Procedure Notes (Addendum)
Procedure Name: Intubation Date/Time: 10/04/2013 10:43 AM Performed by: Ofilia Neas Pre-anesthesia Checklist: Patient identified, Timeout performed, Emergency Drugs available, Suction available and Patient being monitored Patient Re-evaluated:Patient Re-evaluated prior to inductionOxygen Delivery Method: Circle system utilized Preoxygenation: Pre-oxygenation with 100% oxygen Intubation Type: IV induction Ventilation: Mask ventilation without difficulty Laryngoscope Size: Miller and 3 (first DL CRNA. anterior. unable to expose cords with mac 4. small mouth, tight  [employer] second look Dr Bonnielee Haff. anterior.) Grade View: Grade II Tube type: Oral Tube size: 7.5 mm Number of attempts: 2 Airway Equipment and Method: Stylet Placement Confirmation: positive ETCO2,  breath sounds checked- equal and bilateral and ETT inserted through vocal cords under direct vision Secured at: 21 cm Tube secured with: Tape Dental Injury: Teeth and Oropharynx as per pre-operative assessment  Difficulty Due To: Difficulty was anticipated, Difficult Airway- due to anterior larynx, Difficult Airway- due to limited oral opening and Difficult Airway- due to reduced neck mobility Future Recommendations: Recommend- induction with short-acting agent, and alternative techniques readily available   Performed by: Ofilia Neas

## 2013-10-04 NOTE — Progress Notes (Signed)
Pt placed on Auto CPAP 5-20 CMH20 via home mask and tubing.  Pt tolerating well at this time, RT to monitor and assess as needed.

## 2013-10-04 NOTE — Anesthesia Preprocedure Evaluation (Signed)
Anesthesia Evaluation  Patient identified by MRN, date of birth, ID band Patient awake    Reviewed: Allergy & Precautions, H&P , NPO status , Patient's Chart, lab work & pertinent test results  History of Anesthesia Complications (+) PONV  Airway Mallampati: II TM Distance: >3 FB Neck ROM: Full    Dental  (+) Teeth Intact, Caps, Dental Advisory Given   Pulmonary sleep apnea and Continuous Positive Airway Pressure Ventilation , former smoker,  breath sounds clear to auscultation  Pulmonary exam normal       Cardiovascular negative cardio ROS  Rhythm:Regular Rate:Normal     Neuro/Psych negative neurological ROS  negative psych ROS   GI/Hepatic Neg liver ROS, GERD-  ,  Endo/Other  negative endocrine ROS  Renal/GU negative Renal ROS  negative genitourinary   Musculoskeletal negative musculoskeletal ROS (+)   Abdominal   Peds negative pediatric ROS (+)  Hematology negative hematology ROS (+)   Anesthesia Other Findings   Reproductive/Obstetrics                           Anesthesia Physical Anesthesia Plan  ASA: II  Anesthesia Plan: General   Post-op Pain Management:    Induction: Intravenous  Airway Management Planned: Oral ETT  Additional Equipment:   Intra-op Plan:   Post-operative Plan: Extubation in OR  Informed Consent: I have reviewed the patients History and Physical, chart, labs and discussed the procedure including the risks, benefits and alternatives for the proposed anesthesia with the patient or authorized representative who has indicated his/her understanding and acceptance.   Dental advisory given  Plan Discussed with: CRNA  Anesthesia Plan Comments:         Anesthesia Quick Evaluation

## 2013-10-04 NOTE — Transfer of Care (Signed)
Immediate Anesthesia Transfer of Care Note  Patient: Kyle Booker  Procedure(s) Performed: Procedure(s): HEMI LAMINECTOMY L4 - L5 ON THE RIGHT /MICRODISCECTOMY 1 LEVEL (Right)  Patient Location: PACU  Anesthesia Type:General  Level of Consciousness: awake, oriented, patient cooperative, lethargic and responds to stimulation  Airway & Oxygen Therapy: Patient Spontanous Breathing and Patient connected to face mask oxygen  Post-op Assessment: Report given to PACU RN, Post -op Vital signs reviewed and stable and Patient moving all extremities  Post vital signs: Reviewed and stable  Complications: No apparent anesthesia complications

## 2013-10-04 NOTE — Anesthesia Postprocedure Evaluation (Signed)
Anesthesia Post Note  Patient: [name]  Procedure(s) Performed: Procedure(s) (LRB): HEMI LAMINECTOMY L4 - L5 ON THE RIGHT /MICRODISCECTOMY 1 LEVEL (Right)  Anesthesia type: General  Patient location: PACU  Post pain: Pain level controlled  Post assessment: Post-op Vital signs reviewed  Last Vitals:  Filed Vitals:   10/04/13 1510  BP: 117/55  Pulse:   Temp:   Resp:     Post vital signs: Reviewed  Level of consciousness: sedated  Complications: No apparent anesthesia complications

## 2013-10-04 NOTE — Brief Op Note (Signed)
10/04/2013  12:59 PM  PATIENT:  Si Gaul  63 y.o. male  PRE-OPERATIVE DIAGNOSIS:  herniated disk L-4-L-5 and Spinal Stenosis  POST-OPERATIVE DIAGNOSIS:  herniated disk at L-4-L-5 and Spinal Stenosis  PROCEDURE:  Complete Decompressive Lumbar Laminectomy for Spinal Stenosis and Microdiscectomy at L-4-L-5 for Herniated Lumbar Disc. SURGEON:  Surgeon(s) and Role:    * Tobi Bastos, MD - Primary    * Magnus Sinning, MD - Assistant  ASSISTANTS: Tarri Glenn MD   ANESTHESIA:   general  EBL:  Total I/O In: -  Out: 100 [Urine:100]  BLOOD ADMINISTERED:none  DRAINS: none   LOCAL MEDICATIONS USED:  MARCAINE 20cc of 0.50% with Epinephrine at start of case and 20cc of Exparel at end of case.  SPECIMEN:  No Specimen and Source of Specimen:  L-4-L-5  DISPOSITION OF SPECIMEN:  PATHOLOGY  COUNTS:  YES  TOURNIQUET:  * No tourniquets in log *  DICTATION: .Other Dictation: Dictation Number [id number]  PLAN OF CARE: Admit for overnight observation  PATIENT DISPOSITION:  PACU - hemodynamically stable.   Delay start of Pharmacological VTE agent (>24hrs) due to surgical blood loss or risk of bleeding: yes

## 2013-10-04 NOTE — Interval H&P Note (Signed)
History and Physical Interval Note:  10/04/2013 10:03 AM  Kyle Booker  has presented today for surgery, with the diagnosis of herniated disk  The various methods of treatment have been discussed with the patient and family. After consideration of risks, benefits and other options for treatment, the patient has consented to  Procedure(s): HEMI LAMINECTOMY L4 - L5 ON THE RIGHT /MICRODISCECTOMY 1 LEVEL (Right) as a surgical intervention .  The patient's history has been reviewed, patient examined, no change in status, stable for surgery.  I have reviewed the patient's chart and labs.  Questions were answered to the patient's satisfaction.     [name]

## 2013-10-05 ENCOUNTER — Encounter (HOSPITAL_COMMUNITY): Payer: Self-pay | Admitting: Orthopedic Surgery

## 2013-10-05 MED ORDER — TRIAMTERENE-HCTZ 37.5-25 MG PO TABS
1.0000 | ORAL_TABLET | Freq: Every morning | ORAL | Status: DC
  Administered 2013-10-05: 1 via ORAL
  Filled 2013-10-05: qty 1

## 2013-10-05 MED ORDER — OXYCODONE-ACETAMINOPHEN 5-325 MG PO TABS: 1.0000 | ORAL_TABLET | ORAL | Status: DC | PRN

## 2013-10-05 MED ORDER — METHOCARBAMOL 500 MG PO TABS: 500.0000 mg | ORAL_TABLET | Freq: Four times a day (QID) | ORAL | Status: AC | PRN

## 2013-10-05 NOTE — Op Note (Signed)
NAMEDYKE, WEIBLE NO.:  [id number]  MEDICAL RECORD NO.:  [id number]  LOCATION:  38                         FACILITY:  [facility]  PHYSICIAN:  Kipp Brood. Obie Kallenbach, M.D.DATE OF BIRTH:  1950-08-31  DATE OF PROCEDURE:  10/04/2013 DATE OF DISCHARGE:                              OPERATIVE REPORT   PREOPERATIVE DIAGNOSES: 1. Spinal stenosis at L4-5. 2. Severe degenerative disk disease at L4-5. 3. Herniated disk at L4-5 central and to the right.  POSTOPERATIVE DIAGNOSES: 1. Spinal stenosis at L4-5. 2. Severe degenerative disk disease at L4-5. 3. Herniated disk at L4-5 central and to the right.  SURGEON:  Kipp Brood. Gladstone Lighter, M.D.  ASSISTANT:  Tarri Glenn, M.D.  OPERATION: 1. Complete decompressive lumbar laminectomy at L4-5 for severe spinal     stenosis. 2. Microdiskectomy at L4-5 on the right for herniated lumbar disk. 3. Decompression of the L4 and L5 root on the right and left for     foraminal stenosis.  PROCEDURE:  Under general anesthesia, routine orthopedic prepping and draping was carried out.  The appropriate time-out was carried out.  I also marked the right side of his back, although we went central.  I marked the right because the herniated disk was on the right.  At this time, the patient had 2 g of IV Ancef.  He was placed on a spinal table and after time-out as I mentioned, then I injected 20 mL of 0.5% Marcaine and epinephrine on both sides of the spinous process to control bleeding.  We used that prior to making our incision.  At this time, 2 needles were placed in the back for x-ray localization.  We then made incision over the L4-5 space.  The incision was extended distally and proximally in the usual fashion.  The muscle then was separated from the lamina spinous process bilaterally, another x-ray was taken to verify our position.  Following that, we began our central decompressive lumbar laminectomy by removing spinous process of L4.   We went bilaterally and completely decompressed the L4-5 space.  The dura now was free, we were able to go proximally and distally with a hockey stick to make sure we had no further decompression to do.  Instrument was placed in the area and another x-ray was taken.  Following that, we then went out and decompressed the lateral recesses.  We went far out laterally.  The root was extremely tight, that is the 5 root was tight on the right.  We did a nice foraminotomy first, identified the root, gently freed the root up and retracted it gently, and then identified the disk.  Needle was placed in the disk space and verified we were in the disk.  We then carried out an incision the over the posterior longitudinal ligament, and utilized the nerve hook to tease out several pieces of disk material.  Note, this space was basically collapsed.  We went proximally, distally, and up under the root and out laterally as well. We then took another x-ray with the [place] 4 in place.  Following that, we thoroughly irrigated out the area.  We had a nice decompression of the root.  We then did  a little more extensive decompression of the foramina on that side.  We did nice foraminotomies bilaterally for the L5 and L4 roots.  We thoroughly irrigated out the area and then loosely applied some thrombin- soaked Gelfoam and closed the wound in layers in usual fashion.  Prior to closing the skin, I injected 20 mL of Exparel, 10 mL at each side of the incision site and then closed the remaining part of the wound with #1 Vicryl and staples, sterile Neosporin dressings were applied.  The patient left the operative room in satisfactory condition.          ______________________________ Kipp Brood Gladstone Lighter, M.D.     RAG/MEDQ  D:  10/04/2013  T:  10/05/2013  Job:  [id number]

## 2013-10-05 NOTE — Discharge Instructions (Signed)
Change your dressing daily Saturday. Shower only, no tub bath. You may shower starting Saturday Call if any temperatures greater than 101 or any wound complications: [phone] during the day and ask for Dr. Charlestine Night nurse, Brunilda Payor.

## 2013-10-05 NOTE — Progress Notes (Signed)
   Subjective: 1 Day Post-Op Procedure(s) (LRB): HEMI LAMINECTOMY L4 - L5 ON THE RIGHT /MICRODISCECTOMY 1 LEVEL (Right) Patient reports pain as mild.   Patient seen in rounds with Dr. Gladstone Lighter. Patient is well, and has had no acute complaints or problems. Reports pain well controlled. No issues overnight. No SOB or chest pain. Voiding well. Positive flatus. Plan is to go Home after hospital stay.  Objective: Vital signs in last 24 hours: Temp:  [97.1 F (36.2 C)-98.8 F (37.1 C)] 97.6 F (36.4 C) (07/09 0650) Pulse Rate:  [59-101] 89 (07/09 0650) Resp:  [8-16] 16 (07/09 0650) BP: (107-124)/(50-73) 111/67 mmHg (07/09 0650) SpO2:  [93 %-99 %] 97 % (07/09 0650) Weight:  [94.462 kg (208 lb 4 oz)] 94.462 kg (208 lb 4 oz) (07/08 0750)  Intake/Output from previous day:  Intake/Output Summary (Last 24 hours) at 10/05/13 0715 Last data filed at 10/05/13 0600  Gross per 24 hour  Intake 4192.5 ml  Output   1975 ml  Net 2217.5 ml     Recent Labs  10/04/13 0740  NA 137  K 4.2  CL 100  CO2 26  BUN 14  CREATININE 1.01  GLUCOSE 113*  CALCIUM 9.2    Recent Labs  10/04/13 0740  INR 1.02    EXAM General - Patient is Alert and Oriented Extremity - Neurologically intact Intact pulses distally Dorsiflexion/Plantar flexion intact Compartment soft Dressing/Incision - clean, dry, no drainage Motor Function - intact, moving foot and toes well on exam.   Past Medical History  Diagnosis Date  . Complication of anesthesia     slight nausea in past   . Hyperlipidemia   . Meniere's disease     takes dyazide for this problem - no BP problems  . Back pain   . History of gout   . History of nonmelanoma skin cancer   . Psoriasis   . GERD (gastroesophageal reflux disease)   . Diverticulitis   . History of stomach ulcers   . Sleep apnea     uses c pap  . Cancer     basal cell skin cancer    Assessment/Plan: 1 Day Post-Op Procedure(s) (LRB): HEMI LAMINECTOMY L4 - L5 ON THE  RIGHT /MICRODISCECTOMY 1 LEVEL (Right) Active Problems:   Spinal stenosis, lumbar region, with neurogenic claudication  Estimated body mass index is 29.88 kg/(m^2) as calculated from the following:   Height as of this encounter: 5\' 10"  (1.778 m).   Weight as of this encounter: 94.462 kg (208 lb 4 oz). Advance diet Up with therapy D/C IV fluids  DVT Prophylaxis - None Weight-Bearing as tolerated  Progressing well. Will walk with PT this morning before DC home. Discharge instructions given.    Ardeen Jourdain, PA-C Orthopaedic Surgery 10/05/2013, 7:15 AM

## 2013-10-05 NOTE — Discharge Summary (Signed)
Physician Discharge Summary   Patient ID: Kyle Booker MRN: [id number] DOB/AGE: [birth year] 63 y.o.  Admit date: 10/04/2013 Discharge date: 10/05/2013  Primary Diagnosis: Spinal stenosis, lumbar spine   Admission Diagnoses:  Past Medical History  Diagnosis Date  . Complication of anesthesia     slight nausea in past   . Hyperlipidemia   . Meniere's disease     takes dyazide for this problem - no BP problems  . Back pain   . History of gout   . History of nonmelanoma skin cancer   . Psoriasis   . GERD (gastroesophageal reflux disease)   . Diverticulitis   . History of stomach ulcers   . Sleep apnea     uses c pap  . Cancer     basal cell skin cancer   Discharge Diagnoses:   Active Problems:   Spinal stenosis, lumbar region, with neurogenic claudication  Estimated body mass index is 29.88 kg/(m^2) as calculated from the following:   Height as of this encounter: 5' 10"  (1.778 m).   Weight as of this encounter: 94.462 kg (208 lb 4 oz).  Procedure:  Procedure(s) (LRB): HEMI LAMINECTOMY L4 - L5 ON THE RIGHT /MICRODISCECTOMY 1 LEVEL (Right)   Consults: None  HPI: The patient is a 63 year old male who presents with the chief complaint of low back pain. He is also having radiation of pain in the right LE. He has been having this for 2-3 months with no specific injury. In addition he started to develop weakness in the right leg and foot. He failed conservative treatment including rest, NSAIDs, and prednisone. 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   Laboratory Data: Admission on 10/04/2013  Component Date Value Ref Range Status  . aPTT 10/04/2013 38* 24 - 37 seconds Final   Comment:                                 IF BASELINE aPTT IS ELEVATED,                          SUGGEST PATIENT RISK ASSESSMENT                          BE USED TO DETERMINE APPROPRIATE                          ANTICOAGULANT THERAPY.  . Sodium 10/04/2013 137  137 - 147 mEq/L  Final  . Potassium 10/04/2013 4.2  3.7 - 5.3 mEq/L Final  . Chloride 10/04/2013 100  96 - 112 mEq/L Final  . CO2 10/04/2013 26  19 - 32 mEq/L Final  . Glucose, Bld 10/04/2013 113* 70 - 99 mg/dL Final  . BUN 10/04/2013 14  6 - 23 mg/dL Final  . Creatinine, Ser 10/04/2013 1.01  0.50 - 1.35 mg/dL Final  . Calcium 10/04/2013 9.2  8.4 - 10.5 mg/dL Final  . Total Protein 10/04/2013 7.2  6.0 - 8.3 g/dL Final  . Albumin 10/04/2013 4.0  3.5 - 5.2 g/dL Final  . AST 10/04/2013 25  0 - 37 U/L Final  . ALT 10/04/2013 34  0 - 53 U/L Final  . Alkaline Phosphatase 10/04/2013 61  39 - 117 U/L Final  . Total Bilirubin 10/04/2013 0.5  0.3 - 1.2 mg/dL Final  .  GFR calc non Af Amer 10/04/2013 78* >90 mL/min Final  . GFR calc Af Amer 10/04/2013 >90  >90 mL/min Final   Comment: (NOTE)                          The eGFR has been calculated using the CKD EPI equation.                          This calculation has not been validated in all clinical situations.                          eGFR's persistently <90 mL/min signify possible Chronic Kidney                          Disease.  . Anion gap 10/04/2013 11  5 - 15 Final  . Prothrombin Time 10/04/2013 13.4  11.6 - 15.2 seconds Final  . INR 10/04/2013 1.02  0.00 - 1.49 Final  . ABO/RH(D) 10/04/2013 A POS   Final  . Antibody Screen 10/04/2013 NEG   Final  . Sample Expiration 10/04/2013 10/07/2013   Final  . Color, Urine 10/04/2013 YELLOW  YELLOW Final  . APPearance 10/04/2013 CLEAR  CLEAR Final  . Specific Gravity, Urine 10/04/2013 1.019  1.005 - 1.030 Final  . pH 10/04/2013 7.0  5.0 - 8.0 Final  . Glucose, UA 10/04/2013 NEGATIVE  NEGATIVE mg/dL Final  . Hgb urine dipstick 10/04/2013 NEGATIVE  NEGATIVE Final  . Bilirubin Urine 10/04/2013 NEGATIVE  NEGATIVE Final  . Ketones, ur 10/04/2013 NEGATIVE  NEGATIVE mg/dL Final  . Protein, ur 10/04/2013 NEGATIVE  NEGATIVE mg/dL Final  . Urobilinogen, UA 10/04/2013 0.2  0.0 - 1.0 mg/dL Final  . Nitrite 10/04/2013  NEGATIVE  NEGATIVE Final  . Leukocytes, UA 10/04/2013 NEGATIVE  NEGATIVE Final   MICROSCOPIC NOT DONE ON URINES WITH NEGATIVE PROTEIN, BLOOD, LEUKOCYTES, NITRITE, OR GLUCOSE <1000 mg/dL.  . ABO/RH(D) 10/04/2013 A POS   Final  Hospital Outpatient Visit on 09/25/2013  Component Date Value Ref Range Status  . WBC 09/25/2013 6.0  4.0 - 10.5 K/uL Final  . RBC 09/25/2013 4.68  4.22 - 5.81 MIL/uL Final  . Hemoglobin 09/25/2013 15.2  13.0 - 17.0 g/dL Final  . HCT 09/25/2013 43.4  39.0 - 52.0 % Final  . MCV 09/25/2013 92.7  78.0 - 100.0 fL Final  . MCH 09/25/2013 32.5  26.0 - 34.0 pg Final  . MCHC 09/25/2013 35.0  30.0 - 36.0 g/dL Final  . RDW 09/25/2013 12.0  11.5 - 15.5 % Final  . Platelets 09/25/2013 233  150 - 400 K/uL Final  . Sodium 09/25/2013 139  137 - 147 mEq/L Final  . Potassium 09/25/2013 4.4  3.7 - 5.3 mEq/L Final  . Chloride 09/25/2013 99  96 - 112 mEq/L Final  . CO2 09/25/2013 26  19 - 32 mEq/L Final  . Glucose, Bld 09/25/2013 96  70 - 99 mg/dL Final  . BUN 09/25/2013 17  6 - 23 mg/dL Final  . Creatinine, Ser 09/25/2013 1.09  0.50 - 1.35 mg/dL Final  . Calcium 09/25/2013 9.3  8.4 - 10.5 mg/dL Final  . GFR calc non Af Amer 09/25/2013 72* >90 mL/min Final  . GFR calc Af Amer 09/25/2013 83* >90 mL/min Final   Comment: (NOTE)  The eGFR has been calculated using the CKD EPI equation.                          This calculation has not been validated in all clinical situations.                          eGFR's persistently <90 mL/min signify possible Chronic Kidney                          Disease.  Marland Kitchen MRSA, PCR 09/25/2013 NEGATIVE  NEGATIVE Final  . Staphylococcus aureus 09/25/2013 POSITIVE* NEGATIVE Final   Comment:                                 The Xpert SA Assay (FDA                          approved for NASAL specimens                          in patients over 54 years of age),                          is one component of                          a  comprehensive surveillance                          program.  Test performance has                          been validated by [employer] for patients greater                          than or equal to 68 year old.                          It is not intended                          to diagnose infection nor to                          guide or monitor treatment.     X-Rays:Dg Chest 2 View  10/04/2013   CLINICAL DATA:  Preop evaluation.  EXAM: CHEST  2 VIEW  COMPARISON:  None.  FINDINGS: The heart size and mediastinal contours are within normal limits. Both lungs are clear. The visualized skeletal structures are unremarkable.  IMPRESSION: No active cardiopulmonary disease.   Electronically Signed   By: Rolm Baptise M.D.   On: 10/04/2013 08:19   Dg Lumbar Spine 2-3 Views  10/04/2013   ADDENDUM REPORT: 10/04/2013 11:39   Electronically Signed   By: Margaree Mackintosh M.D.   On: 10/04/2013 11:39   10/04/2013   CLINICAL DATA:  lumbar spine  pain  EXAM: LUMBAR SPINE - 2-3 VIEW  COMPARISON:  CT myelogram dated 09/14/2013  FINDINGS: There is no evidence of lumbar spine fracture. Alignment is normal. Disc space narrowing and endplate sclerosis at Y4-0 and L5-S1.  IMPRESSION: Degenerative disc disease changes at L4-5 and L5-S1. No acute osseus abnormalities.  Electronically Signed: By: Margaree Mackintosh M.D. On: 10/04/2013 08:19   Ct Lumbar Spine W Contrast  09/14/2013   CLINICAL DATA:  Low back and right leg pain.  EXAM: LUMBAR MYELOGRAM  FLUOROSCOPY TIME:  40 seconds.  PROCEDURE: After thorough discussion of risks and benefits of the procedure including bleeding, infection, injury to nerves, blood vessels, adjacent structures as well as headache and CSF leak, written and oral informed consent was obtained. Consent was obtained by Dr. Rolla Flatten. Time out form was completed.  Patient was positioned prone on the fluoroscopy table. Local anesthesia was provided with 1% lidocaine without  epinephrine after prepped and draped in the usual sterile fashion. Puncture was performed at L3 using a 3 1/2 inch 22-gauge spinal needle via midline approach. Using a single pass through the dura, the needle was placed within the thecal sac, with return of clear CSF. 15 mL of Omnipaque-180 was injected into the thecal sac, with normal opacification of the nerve roots and cauda equina consistent with free flow within the subarachnoid space.  I personally performed the lumbar puncture and administered the intrathecal contrast. I also personally supervised acquisition of the myelogram images.  TECHNIQUE: Contiguous axial images were obtained through the Lumbar spine after the intrathecal infusion of infusion. Coronal and sagittal reconstructions were obtained of the axial image sets.  COMPARISON:  Outside MRI from [place] dated 08/24/2013.  FINDINGS: LUMBAR MYELOGRAM FINDINGS:  There is good opacification subarachnoid space. The L5-S1 level is transitional. L5 is partially sacralized bilaterally.  L4-5 disc space is markedly narrowed. There are endplate sclerotic changes. There is a moderate stenosis at L4-5 secondary to posterior element hypertrophy and a large ventral defect. Bilateral L5 nerve root encroachment is present, right greater than left.  With the patient standing, the alignment remains anatomic. There is no dynamic instability.  CT LUMBAR MYELOGRAM FINDINGS:  Conus ends at the T12 level. No prevertebral or paraspinous masses. Mild atheromatous change of the aorta appears non aneurysmal.  The individual disc spaces were examined with axial images as follows:  L1-L2:  Mild bulge.  No impingement.  L2-L3:  Mild bulge.  No impingement.  L3-L4:  Normal interspace.  L4-L5: There is a large central and rightward disc extrusion. Endplate sclerotic change with osteophytic spurring. Moderate facet and ligamentum flavum hypertrophy. Moderate central canal stenosis. Right greater than left L5 nerve  root impingement. Bilateral neural foraminal narrowing is multifactorial related to osseous spurring and disc material. Right greater than left L4 nerve root impingement is likely.  L5-S1:  Transitional level.  No impingement.  IMPRESSION: LUMBAR MYELOGRAM IMPRESSION:  Transitional anatomy.  See comments above.  Moderate stenosis L4-5, with right greater than left L5 nerve root impingement. No dynamic instability.  CT LUMBAR MYELOGRAM IMPRESSION:  Severe disc space narrowing with osseous reaction at L4-5, central and rightward disc extrusion, associated with foraminal narrowing; right greater than left L5 and L4 nerve root impingement are noted.  Transitional L5-S1 level without impingement.   Electronically Signed   By: Rolla Flatten M.D.   On: 09/14/2013 12:16   Dg Spine Portable 1 View  10/04/2013   CLINICAL DATA:  Lumbar spine surgery.  EXAM: PORTABLE  SPINE - 1 VIEW  COMPARISON:  Prior portable lumbar spine 10/04/2013 at 1137 hr  FINDINGS: Vertebral body is labeled as per prior exam. Metallic marker noted at the posterior aspect of the L4-L5 disc space.  IMPRESSION: Metallic marker at the posterior aspect of the L4-L5 disc space.   Electronically Signed   By: Marcello Moores  Register   On: 10/04/2013 12:49   Dg Spine Portable 1 View  10/04/2013   CLINICAL DATA:  L4-L5 disc surgery  EXAM: PORTABLE SPINE - 1 VIEW  COMPARISON:  Portable intraoperative exam labeled #3 at 1137 hr compared to 1111 hr  FINDINGS: Labeled with 5 lumbar vertebra, as well as prior exam.  Disc space narrowing L4-L5.  Metallic probes via dorsal approach project dorsal to the inferior L4 and superior L5 levels.  IMPRESSION: Posterior localization of the inferior L4 and superior L5 levels.   Electronically Signed   By: Lavonia Dana M.D.   On: 10/04/2013 12:10   Dg Spine Portable 1 View  10/04/2013   CLINICAL DATA:  Intra op lumbar spine lead large evaluation  EXAM: PORTABLE SPINE - 1 VIEW  COMPARISON:  Cross-table lateral lumbar spine 10/04/2013.   FINDINGS: Surgical probe projects along the posterior superior aspect of the L4 spinous process. No acute osseous abnormalities. Degenerative disc disease changes at L4-5.  IMPRESSION: Intra op lumbar spine landmark evaluation.   Electronically Signed   By: Margaree Mackintosh M.D.   On: 10/04/2013 11:56   Dg Spine Portable 1 View  10/04/2013   CLINICAL DATA:  Intraoperative localization  EXAM: PORTABLE SPINE - 1 VIEW  COMPARISON:  Film from earlier in the same day.  FINDINGS: Radiopaque needles are noted within the soft tissues posterior to the vertebral bodies at the L3-4 interspace and at the midportion of the L4 vertebral body.   Electronically Signed   By: Inez Catalina M.D.   On: 10/04/2013 11:29   Dg Myelography Lumbar Inj Lumbosacral  09/14/2013   CLINICAL DATA:  Low back and right leg pain.  EXAM: LUMBAR MYELOGRAM  FLUOROSCOPY TIME:  40 seconds.  PROCEDURE: After thorough discussion of risks and benefits of the procedure including bleeding, infection, injury to nerves, blood vessels, adjacent structures as well as headache and CSF leak, written and oral informed consent was obtained. Consent was obtained by Dr. Rolla Flatten. Time out form was completed.  Patient was positioned prone on the fluoroscopy table. Local anesthesia was provided with 1% lidocaine without epinephrine after prepped and draped in the usual sterile fashion. Puncture was performed at L3 using a 3 1/2 inch 22-gauge spinal needle via midline approach. Using a single pass through the dura, the needle was placed within the thecal sac, with return of clear CSF. 15 mL of Omnipaque-180 was injected into the thecal sac, with normal opacification of the nerve roots and cauda equina consistent with free flow within the subarachnoid space.  I personally performed the lumbar puncture and administered the intrathecal contrast. I also personally supervised acquisition of the myelogram images.  TECHNIQUE: Contiguous axial images were obtained through  the Lumbar spine after the intrathecal infusion of infusion. Coronal and sagittal reconstructions were obtained of the axial image sets.  COMPARISON:  Outside MRI from [place] dated 08/24/2013.  FINDINGS: LUMBAR MYELOGRAM FINDINGS:  There is good opacification subarachnoid space. The L5-S1 level is transitional. L5 is partially sacralized bilaterally.  L4-5 disc space is markedly narrowed. There are endplate sclerotic changes. There is a moderate stenosis at L4-5 secondary to  posterior element hypertrophy and a large ventral defect. Bilateral L5 nerve root encroachment is present, right greater than left.  With the patient standing, the alignment remains anatomic. There is no dynamic instability.  CT LUMBAR MYELOGRAM FINDINGS:  Conus ends at the T12 level. No prevertebral or paraspinous masses. Mild atheromatous change of the aorta appears non aneurysmal.  The individual disc spaces were examined with axial images as follows:  L1-L2:  Mild bulge.  No impingement.  L2-L3:  Mild bulge.  No impingement.  L3-L4:  Normal interspace.  L4-L5: There is a large central and rightward disc extrusion. Endplate sclerotic change with osteophytic spurring. Moderate facet and ligamentum flavum hypertrophy. Moderate central canal stenosis. Right greater than left L5 nerve root impingement. Bilateral neural foraminal narrowing is multifactorial related to osseous spurring and disc material. Right greater than left L4 nerve root impingement is likely.  L5-S1:  Transitional level.  No impingement.  IMPRESSION: LUMBAR MYELOGRAM IMPRESSION:  Transitional anatomy.  See comments above.  Moderate stenosis L4-5, with right greater than left L5 nerve root impingement. No dynamic instability.  CT LUMBAR MYELOGRAM IMPRESSION:  Severe disc space narrowing with osseous reaction at L4-5, central and rightward disc extrusion, associated with foraminal narrowing; right greater than left L5 and L4 nerve root impingement are noted.   Transitional L5-S1 level without impingement.   Electronically Signed   By: Rolla Flatten M.D.   On: 09/14/2013 12:16    EKG: Orders placed during the hospital encounter of 10/04/13  . EKG 12-LEAD  . EKG 12-LEAD     Hospital Course: Kyle Booker is a 63 y.o. who was admitted to [facility]. They were brought to the operating room on 10/04/2013 and underwent Procedure(s): HEMI LAMINECTOMY L4 - L5 ON THE RIGHT /MICRODISCECTOMY 1 LEVEL.  Patient tolerated the procedure well and was later transferred to the recovery room and then to the orthopaedic floor for postoperative care.  They were given PO and IV analgesics for pain control following their surgery.  They were given 24 hours of postoperative antibiotics of  Anti-infectives   Start     Dose/Rate Route Frequency Ordered Stop   10/04/13 1800  ceFAZolin (ANCEF) IVPB 1 g/50 mL premix     1 g 100 mL/hr over 30 Minutes Intravenous Every 8 hours 10/04/13 1418 10/05/13 1759   10/04/13 1218  polymyxin B 500,000 Units, bacitracin 50,000 Units in sodium chloride irrigation 0.9 % 500 mL irrigation  Status:  Discontinued       As needed 10/04/13 1218 10/04/13 1305   10/04/13 0719  ceFAZolin (ANCEF) IVPB 2 g/50 mL premix     2 g 100 mL/hr over 30 Minutes Intravenous On call to O.R. 10/04/13 0719 10/04/13 1035     and started on DVT prophylaxis in the form of None.   PT and OT was ordered.  Discharge planning consulted to help with postop disposition and equipment needs.  Patient had a good night on the evening of surgery.  They started to get up OOB with therapy on day one.  Patient was seen in rounds and was ready to go home.   Diet: Regular diet Activity:WBAT Follow-up:in 2 weeks Disposition - Home Discharged Condition: stable   Discharge Instructions   Call MD / Call 911    Complete by:  As directed   If you experience chest pain or shortness of breath, CALL 911 and be transported to the hospital emergency room.  If you develope a  fever above  49 F, pus (white drainage) or increased drainage or redness at the wound, or calf pain, call your surgeon's office.     Constipation Prevention    Complete by:  As directed   Drink plenty of fluids.  Prune juice may be helpful.  You may use a stool softener, such as Colace (over the counter) 100 mg twice a day.  Use MiraLax (over the counter) for constipation as needed.     Diet general    Complete by:  As directed      Discharge instructions    Complete by:  As directed   Change your dressing daily Saturday. Shower only, no tub bath. You may shower starting Saturday Call if any temperatures greater than 101 or any wound complications: [phone] during the day and ask for Dr. Charlestine Night nurse, Brunilda Payor.     Driving restrictions    Complete by:  As directed   No driving for 2 weeks     Increase activity slowly as tolerated    Complete by:  As directed      Lifting restrictions    Complete by:  As directed   No lifting            Medication List         ALIVE MENS ENERGY PO  Take 1 tablet by mouth daily.     cholecalciferol 1000 UNITS tablet  Commonly known as:  VITAMIN D  Take 1,000 Units by mouth daily.     CO Q 10 PO  Take 1 capsule by mouth daily.     diphenhydrAMINE spray  Commonly known as:  BENADRYL  Apply 1 spray topically every 4 (four) hours as needed for itching.     dutasteride 0.5 MG capsule  Commonly known as:  AVODART  Take 0.5 mg by mouth every other day.     ibuprofen 200 MG tablet  Commonly known as:  ADVIL,MOTRIN  Take 800 mg by mouth every 6 (six) hours as needed for moderate pain.     Magnesium 500 MG Caps  Take 500 mg by mouth 2 (two) times daily.     methocarbamol 500 MG tablet  Commonly known as:  ROBAXIN  Take 1 tablet (500 mg total) by mouth every 6 (six) hours as needed for muscle spasms.     OIL OF OREGANO PO  Take 1 capsule by mouth 2 (two) times daily.     omega-3 acid ethyl esters 1 G capsule  Commonly known as:   LOVAZA  Take 1 g by mouth 2 (two) times daily.     omeprazole 20 MG capsule  Commonly known as:  PRILOSEC  Take 20 mg by mouth daily.     oxyCODONE-acetaminophen 5-325 MG per tablet  Commonly known as:  PERCOCET/ROXICET  Take 1-2 tablets by mouth every 4 (four) hours as needed for moderate pain.     RED YEAST RICE PO  Take 1 capsule by mouth 2 (two) times daily.     rosuvastatin 10 MG tablet  Commonly known as:  CRESTOR  Take 10 mg by mouth 2 (two) times a week. Monday and Thursday     Saw Palmetto (Serenoa repens) 320 MG Caps  Take 320 mg by mouth 2 (two) times daily.     triamterene-hydrochlorothiazide 37.5-25 MG per capsule  Commonly known as:  DYAZIDE  Take 1 capsule by mouth every morning.     VITAMIN B 12 PO  Take 1,000 mg by mouth daily.  Follow-up Information   Follow up with GIOFFRE,RONALD A, MD. Schedule an appointment as soon as possible for a visit in 2 weeks.   Specialty:  Orthopedic Surgery   Contact information:   [address] [place] [address] [phone]       Signed: Ardeen Jourdain, PA-C Orthopaedic Surgery 10/05/2013, 7:18 AM

## 2013-10-05 NOTE — Progress Notes (Signed)
Pt to d/c home. Potty chair delivered to room at d/c. Dressing changed, no d/c at site. AVS reviewed and "My Chart" discussed with pt. Pt capable of verbalizing medications, dressing changes, signs and symptoms of infection, and follow-up appointments. Remains hemodynamically stable. No signs and symptoms of distress. Educated pt to return to ER in the case of SOB, dizziness, or chest pain.

## 2013-10-05 NOTE — Evaluation (Signed)
Physical Therapy Evaluation Patient Details Name: Kyle Booker MRN: [id number] DOB: 28-May-1950 Today's Date: 10/05/2013   History of Present Illness  Pt is s/p HEMI LAMINECTOMY L4 - L5 ON THE RIGHT /MICRODISCECTOMY 1 LEVEL   Clinical Impression  On eval, pt was supervision level assist for mobility-able to ambulate ~250 feet without assistive device. Practiced stair negotiation as well. No further questions from pt. All education completed. Ready to d/c from PT standpoint.     Follow Up Recommendations No PT follow up    Equipment Recommendations  None recommended by PT    Recommendations for Other Services       Precautions / Restrictions Precautions Precautions: Back Precaution Booklet Issued: Yes (comment) Precaution Comments: Pt able to recall back precautions Restrictions Weight Bearing Restrictions: No      Mobility  Bed Mobility Overal bed mobility: Needs Assistance Bed Mobility: Rolling;Sidelying to Sit Rolling: Supervision Sidelying to sit: Supervision       General bed mobility comments: pt sitting in recliner  Transfers Overall transfer level: Needs assistance Equipment used: None Transfers: Sit to/from Stand Sit to Stand: Supervision         General transfer comment: verbal cues for back precautions. Guard assist for safety due to radiating pain and some trembling with standing due to pain.   Ambulation/Gait Ambulation/Gait assistance: Supervision Ambulation Distance (Feet): 250 Feet Assistive device: None Gait Pattern/deviations: Antalgic     General Gait Details: slightly antalgic gait pattern R LE.   Stairs Stairs: Yes   Stair Management: Step to pattern;Forwards;Two rails Number of Stairs: 2 (x2) General stair comments: VCSs safety, technique, sequence. Pt preferred to step up with R foot first-felt it was less painful.   Wheelchair Mobility    Modified Rankin (Stroke Patients Only)       Balance                                              Pertinent Vitals/Pain 5/10 back, R hip/buttock area with transitions    Home Living Family/patient expects to be discharged to:: Private residence Living Arrangements: Spouse/significant other Available Help at Discharge: Family Type of Home: House Home Access: Stairs to enter Entrance Stairs-Rails: Can reach both;Right;Left Entrance Stairs-Number of Steps: 3 Home Layout: Able to live on main level with bedroom/bathroom Home Equipment: None      Prior Function Level of Independence: Independent               Hand Dominance        Extremity/Trunk Assessment   Upper Extremity Assessment: Defer to OT evaluation           Lower Extremity Assessment: RLE deficits/detail RLE Deficits / Details: reports hip/buttock pain with transitions, step climbing    Cervical / Trunk Assessment: Normal  Communication   Communication: No difficulties  Cognition Arousal/Alertness: Awake/alert Behavior During Therapy: WFL for tasks assessed/performed Overall Cognitive Status: Within Functional Limits for tasks assessed                      General Comments      Exercises        Assessment/Plan    PT Assessment Patent does not need any further PT services  PT Diagnosis     PT Problem List    PT Treatment Interventions     PT Goals (Current goals can  be found in the Care Plan section) Acute Rehab PT Goals Patient Stated Goal: return to independence. PT Goal Formulation: No goals set, d/c therapy    Frequency     Barriers to discharge        Co-evaluation               End of Session   Activity Tolerance: Patient tolerated treatment well Patient left: in chair;with call bell/phone within reach      Functional Assessment Tool Used: clinical judgement Functional Limitation: Mobility: Walking and moving around Mobility: Walking and Moving Around Current Status ([id number]): At least 1 percent but less than 20  percent impaired, limited or restricted Mobility: Walking and Moving Around Goal Status [phone]): At least 1 percent but less than 20 percent impaired, limited or restricted Mobility: Walking and Moving Around Discharge Status [phone]): At least 1 percent but less than 20 percent impaired, limited or restricted    Time: 0950-1003 PT Time Calculation (min): 13 min   Charges:   PT Evaluation $Initial PT Evaluation Tier I: 1 Procedure PT Treatments $Gait Training: 8-22 mins   PT G Codes:   Functional Assessment Tool Used: clinical judgement Functional Limitation: Mobility: Walking and moving around    [employer], MPT Pager: [phone]

## 2013-10-05 NOTE — Plan of Care (Signed)
Problem: Consults Goal: Diagnosis - Spinal Surgery Outcome: Completed/Met Date Met:  10/05/13 Lumbar Laminectomy (Complex) Hemilaminectomy

## 2013-10-05 NOTE — Evaluation (Signed)
Occupational Therapy Evaluation Patient Details Name: Kyle Booker MRN: [id number] DOB: 04-Oct-1950 Today's Date: 10/05/2013    History of Present Illness Pt is s/p HEMI LAMINECTOMY L4 - L5 ON THE RIGHT /MICRODISCECTOMY 1 LEVEL    Clinical Impression   Pt limited by 8/10 radiating type pain in R hip with transitions but did well once up to transfer into the bathroom and perform sponge bath and dress. Still with radiating pain with sit to stand from 3in1 several times in bathroom. Educated on all back precautions and issued handout. Also educated on AE options for LB self care and toilet aid in order to adhere to all back precautions. All education completed. No further acute OT needs.    Follow Up Recommendations  No OT follow up;Supervision - Intermittent    Equipment Recommendations  3 in 1 bedside comode    Recommendations for Other Services       Precautions / Restrictions Precautions Precautions: Back Precaution Booklet Issued: Yes (comment) Precaution Comments: Issued back care handout and reviewed all precautions with pt Restrictions Weight Bearing Restrictions: No      Mobility Bed Mobility Overal bed mobility: Needs Assistance Bed Mobility: Rolling;Sidelying to Sit Rolling: Supervision Sidelying to sit: Supervision       General bed mobility comments: supervision for back precautions and verbal cues for log roll  Transfers Overall transfer level: Needs assistance Equipment used: None Transfers: Sit to/from Stand Sit to Stand: Min guard         General transfer comment: verbal cues for back precautions. Guard assist for safety due to radiating pain and some trembling with standing due to pain.     Balance                                            ADL Overall ADL's : Needs assistance/impaired Eating/Feeding: Independent;Sitting   Grooming: Wash/dry face;Applying deodorant;Brushing hair;Set up;Sitting   Upper Body Bathing:  Supervision/ safety;Set up;Sitting Upper Body Bathing Details (indicate cue type and reason): supevision for back precautions Lower Body Bathing: Minimal assistance;Sit to/from stand Lower Body Bathing Details (indicate cue type and reason): min assist for posterior periarea as he is unable to reach without twisting.  Upper Body Dressing : Supervision/safety;Set up;Standing   Lower Body Dressing: Min guard;Sit to/from stand Lower Body Dressing Details (indicate cue type and reason): underwear, socks, shoes, pants Toilet Transfer: Min guard;Ambulation;BSC   Toileting- Clothing Manipulation and Hygiene: Moderate assistance;Sitting/lateral lean Toileting - Clothing Manipulation Details (indicate cue type and reason): pt unable to reach posterior periarea without twisting. Tub/ Shower Transfer: Min guard;Walk-in shower     General ADL Comments: Pt limited by radiating pain in R hip mostly with transitions of sit to stand and stand to sit. Reviewed all back precautions and handout for back care. Pt is unable to reach posterior periarea without twisting so educated pt on toilet aid option and coverage. He has a Secondary school teacher so discussed uses for LB dressing if needed but he does fairly well with crossling LEs up to him. Educated on LHS option for washing LEs easier versus crossing LEs up and for use with washing periareas. Pt will need a 3in1 for toilet as he has difficulty with sit to stand and feel higher surface will be helpful and armrests. Educated on use as shower chair also. Pt plans to have wife obtain AE for him.  Vision                     Perception     Praxis      Pertinent Vitals/Pain 8/10 R hip ; radiating 5/10 later in session at rest; informed nursing, reposition.     Hand Dominance     Extremity/Trunk Assessment Upper Extremity Assessment Upper Extremity Assessment: Overall WFL for tasks assessed           Communication Communication Communication: No  difficulties   Cognition Arousal/Alertness: Awake/alert Behavior During Therapy: WFL for tasks assessed/performed Overall Cognitive Status: Within Functional Limits for tasks assessed                     General Comments       Exercises       Shoulder Instructions      Home Living Family/patient expects to be discharged to:: Private residence Living Arrangements: Spouse/significant other Available Help at Discharge: Family Type of Home: House Home Access: Stairs to enter Technical brewer of Steps: 3 Entrance Stairs-Rails: Can reach both;Right;Left Home Layout: Able to live on main level with bedroom/bathroom     Bathroom Shower/Tub: Occupational psychologist: Standard     Home Equipment: None          Prior Functioning/Environment Level of Independence: Independent             OT Diagnosis:     OT Problem List:     OT Treatment/Interventions:      OT Goals(Current goals can be found in the care plan section) Acute Rehab OT Goals Patient Stated Goal: return to independence.  OT Frequency:     Barriers to D/C:            Co-evaluation              End of Session    Activity Tolerance: Patient limited by pain Patient left: in chair;with call bell/phone within reach   Time: 0839-0928 OT Time Calculation (min): 49 min Charges:  OT General Charges $OT Visit: 1 Procedure OT Evaluation $Initial OT Evaluation Tier I: 1 Procedure OT Treatments $Self Care/Home Management : 23-37 mins $Therapeutic Activity: 8-22 mins G-Codes: OT G-codes **NOT FOR INPATIENT CLASS** Functional Assessment Tool Used: clinical judgement.  Functional Limitation: Self care Self Care Current Status [phone]): At least 20 percent but less than 40 percent impaired, limited or restricted Self Care Goal Status ([id number]): At least 20 percent but less than 40 percent impaired, limited or restricted Self Care Discharge Status [phone]): At least 20 percent but  less than 40 percent impaired, limited or restricted  Jules Schick [phone] 10/05/2013, 9:41 AM

## 2014-10-18 ENCOUNTER — Other Ambulatory Visit: Payer: Self-pay | Admitting: Gastroenterology

## 2019-01-25 ENCOUNTER — Emergency Department (HOSPITAL_COMMUNITY)
Admission: EM | Admit: 2019-01-25 | Discharge: 2019-01-25 | Disposition: A | Discharge status: Home / Self Care | Payer: 59 | Attending: Emergency Medicine | Admitting: Emergency Medicine

## 2019-01-25 ENCOUNTER — Encounter (HOSPITAL_COMMUNITY): Payer: Self-pay | Admitting: Emergency Medicine

## 2019-01-25 ENCOUNTER — Emergency Department (HOSPITAL_COMMUNITY): Payer: 59

## 2019-01-25 DIAGNOSIS — N410 Acute prostatitis: Secondary | ICD-10-CM | POA: Diagnosis not present

## 2019-01-25 DIAGNOSIS — Z79899 Other long term (current) drug therapy: Secondary | ICD-10-CM | POA: Insufficient documentation

## 2019-01-25 DIAGNOSIS — R109 Unspecified abdominal pain: Secondary | ICD-10-CM | POA: Diagnosis present

## 2019-01-25 DIAGNOSIS — Z87891 Personal history of nicotine dependence: Secondary | ICD-10-CM | POA: Insufficient documentation

## 2019-01-25 LAB — LACTIC ACID, PLASMA: Lactic Acid, Venous: 1.5 mmol/L (ref 0.5–1.9)

## 2019-01-25 LAB — COMPREHENSIVE METABOLIC PANEL
ALT: 36 U/L (ref 0–44)
AST: 24 U/L (ref 15–41)
Albumin: 4.3 g/dL (ref 3.5–5.0)
Alkaline Phosphatase: 61 U/L (ref 38–126)
Anion gap: 9 (ref 5–15)
BUN: 24 mg/dL — ABNORMAL HIGH (ref 8–23)
CO2: 25 mmol/L (ref 22–32)
Calcium: 8.9 mg/dL (ref 8.9–10.3)
Chloride: 103 mmol/L (ref 98–111)
Creatinine, Ser: 1.13 mg/dL (ref 0.61–1.24)
GFR calc Af Amer: >60 mL/min (ref >60)
GFR calc non Af Amer: >60 mL/min (ref >60)
Glucose, Bld: 112 mg/dL — ABNORMAL HIGH (ref 70–99)
Potassium: 3.7 mmol/L (ref 3.5–5.1)
Sodium: 137 mmol/L (ref 135–145)
Total Bilirubin: 0.9 mg/dL (ref 0.3–1.2)
Total Protein: 7 g/dL (ref 6.5–8.1)

## 2019-01-25 LAB — URINALYSIS, ROUTINE W REFLEX MICROSCOPIC
Bilirubin Urine: NEGATIVE
Glucose, UA: NEGATIVE mg/dL
Ketones, ur: NEGATIVE mg/dL
Nitrite: NEGATIVE
Protein, ur: 100 mg/dL — AB
RBC / HPF: >50 RBC/hpf — ABNORMAL HIGH (ref 0–5)
Specific Gravity, Urine: 1.025 (ref 1.005–1.030)
pH: 7 (ref 5.0–8.0)

## 2019-01-25 LAB — CBC WITH DIFFERENTIAL/PLATELET
Abs Immature Granulocytes: 0.07 10*3/uL (ref 0.00–0.07)
Basophils Absolute: 0 10*3/uL (ref 0.0–0.1)
Basophils Relative: 0 %
Eosinophils Absolute: 0 10*3/uL (ref 0.0–0.5)
Eosinophils Relative: 0 %
HCT: 45.3 % (ref 39.0–52.0)
Hemoglobin: 15.7 g/dL (ref 13.0–17.0)
Immature Granulocytes: 1 %
Lymphocytes Relative: 2 %
Lymphs Abs: 0.3 10*3/uL — ABNORMAL LOW (ref 0.7–4.0)
MCH: 33.5 pg (ref 26.0–34.0)
MCHC: 34.7 g/dL (ref 30.0–36.0)
MCV: 96.6 fL (ref 80.0–100.0)
Monocytes Absolute: 0.2 10*3/uL (ref 0.1–1.0)
Monocytes Relative: 2 %
Neutro Abs: 11.1 10*3/uL — ABNORMAL HIGH (ref 1.7–7.7)
Neutrophils Relative %: 95 %
Platelets: 181 10*3/uL (ref 150–400)
RBC: 4.69 MIL/uL (ref 4.22–5.81)
RDW: 11.7 % (ref 11.5–15.5)
WBC: 11.7 10*3/uL — ABNORMAL HIGH (ref 4.0–10.5)
nRBC: 0 % (ref 0.0–0.2)

## 2019-01-25 MED ORDER — ACETAMINOPHEN 325 MG PO TABS
325.0000 mg | ORAL_TABLET | Freq: Once | ORAL | Status: AC
  Administered 2019-01-25: 08:00:00 325 mg via ORAL
  Filled 2019-01-25: qty 1

## 2019-01-25 MED ORDER — ACETAMINOPHEN 325 MG PO TABS
650.0000 mg | ORAL_TABLET | Freq: Once | ORAL | Status: AC
  Administered 2019-01-25: 06:00:00 650 mg via ORAL
  Filled 2019-01-25: qty 2

## 2019-01-25 MED ORDER — SODIUM CHLORIDE 0.9 % IV SOLN
1.0000 g | Freq: Once | INTRAVENOUS | Status: AC
  Administered 2019-01-25: 07:00:00 1 g via INTRAVENOUS
  Filled 2019-01-25: qty 10

## 2019-01-25 MED ORDER — OXYCODONE-ACETAMINOPHEN 5-325 MG PO TABS: 1.0000 | ORAL_TABLET | ORAL | 0 refills | Status: AC | PRN

## 2019-01-25 MED ORDER — CIPROFLOXACIN HCL 500 MG PO TABS: 500.0000 mg | ORAL_TABLET | Freq: Two times a day (BID) | ORAL | 0 refills | Status: AC

## 2019-01-25 MED ORDER — SODIUM CHLORIDE 0.9 % IV SOLN
1.0000 g | Freq: Once | INTRAVENOUS | Status: AC
  Administered 2019-01-25: 1 g via INTRAVENOUS
  Filled 2019-01-25: qty 10

## 2019-01-25 MED ORDER — ONDANSETRON HCL 4 MG/2ML IJ SOLN
4.0000 mg | Freq: Once | INTRAMUSCULAR | Status: AC
  Administered 2019-01-25: 06:00:00 4 mg via INTRAVENOUS
  Filled 2019-01-25: qty 2

## 2019-01-25 MED ORDER — ONDANSETRON 4 MG PO TBDP
4.0000 mg | ORAL_TABLET | Freq: Three times a day (TID) | ORAL | 0 refills | Status: AC | PRN
Start: 2019-01-25 — End: ?

## 2019-01-25 MED ORDER — OXYCODONE-ACETAMINOPHEN 5-325 MG PO TABS
1.0000 | ORAL_TABLET | Freq: Once | ORAL | Status: AC
  Administered 2019-01-25: 10:00:00 1 via ORAL
  Filled 2019-01-25: qty 1

## 2019-01-25 MED ORDER — SODIUM CHLORIDE 0.9 % IV BOLUS
1000.0000 mL | Freq: Once | INTRAVENOUS | Status: AC
  Administered 2019-01-25: 08:00:00 1000 mL via INTRAVENOUS

## 2019-01-25 NOTE — ED Notes (Signed)
Bladder scan showed 6 ML

## 2019-01-25 NOTE — ED Notes (Signed)
Post discharged pt walked to the restroom; with movement/walking, pt verbalizing experiencing pain and he/wife would like to speak to the provider prior to discharge.

## 2019-01-25 NOTE — ED Triage Notes (Signed)
Patient here from home with complaints of painful urination, fever, urinary frequency, and n/v that started at 9pm last night.

## 2019-01-25 NOTE — Discharge Instructions (Signed)
Take Cipro as prescribed and complete the full course. Take Zofran as needed as prescribed for nausea and vomiting.   Continue with Tylenol at home for fever as needed as directed. Home to rest, push hydrating fluids.  Follow-up with your urologist tomorrow as scheduled.  Return to the ER for worsening pain, uncontrolled vomiting, uncontrolled fever or any other concerns.

## 2019-01-25 NOTE — ED Provider Notes (Signed)
MSE was initiated and I personally evaluated the patient and placed orders (if any) at  6:44 AM on January 25, 2019.  The patient appears stable so that the remainder of the MSE may be completed by another provider.  68 year old male with a history of Mnire's disease, BPH, diverticulitis and gastric ulcers who presents to the emergency department with a chief complaint of fever, dysuria, urinary frequency, hematuria, and dry heaves that began at approximately 0300.   He also notes that he has been having bilateral low back pain for the last week.  He denies abdominal pain, diarrhea, constipation, chills, chest pain, cough, shortness of breath.  No treatment prior to arrival.  Patient has a mild leukocytosis of 11.7.  UA with WBC clumps, large leukocyte esterase, large hemoglobinuria.  Will order CT stone study.  Will order Rocephin as urine is concerning for pyelonephritis.  Urine culture is pending.  Zofran ordered for nausea and vomiting.   Joanne Gavel, PA-C 01/25/19 0716    Fatima Blank, MD 01/26/19 [phone]

## 2019-01-25 NOTE — ED Notes (Signed)
Per provider continue to discharge pt administering percocet for pt pain. Pt/wife made aware of percocet prescription added to pick up from pharmacy; instructions of use reviewed and importance of continuing treating fever but noting that percocet has tylenol in it. Pt/wife verbalize understanding of daily dose of tylenol not to exceed 4000 mg.

## 2019-01-25 NOTE — ED Notes (Signed)
Pt pain treated and will reassess; urology consulted as well per pt request.

## 2019-01-25 NOTE — ED Notes (Signed)
Patient transported to CT 

## 2019-01-25 NOTE — ED Provider Notes (Addendum)
[place] DEPT Provider Note   CSN: [id number] Arrival date & time: 01/25/19  0425     History   Chief Complaint Chief Complaint  Patient presents with   Dysuria   Nausea   Emesis   Urinary Frequency    HPI Kyle Booker is a 68 y.o. male.     68 year old male with possible history of BPH, previous prostatitis presents with complaint of urinary frequency, dysuria, hematuria, fevers, nausea, vomiting.  Patient states that last week he had low back pain that felt "deeper but then my muscles."  Developed urinary symptoms last night which worsened through the night which prompted him to come to the emergency room.  Patient states that he had pain in his testicles last night, no pain currently, does report having loose nonbloody stools, denies pain with bowel movements.  Symptoms today feel similar to prior episode of prostatitis.  Patient is scheduled to see his urologist tomorrow for his regular 72-month check.     Past Medical History:  Diagnosis Date   Back pain    Cancer ([place])    basal cell skin cancer   Complication of anesthesia    slight nausea in past    Diverticulitis    GERD (gastroesophageal reflux disease)    History of gout    History of nonmelanoma skin cancer    History of stomach ulcers    Hyperlipidemia    Meniere's disease    takes dyazide for this problem - no BP problems   Psoriasis    Sleep apnea    uses c pap    Patient Active Problem List   Diagnosis Date Noted   Spinal stenosis, lumbar region, with neurogenic claudication 10/04/2013    Past Surgical History:  Procedure Laterality Date   COLON SURGERY  2005   resection for diverticulitis   FRACTURE SURGERY  1974   fracgtures femur pinning   HERNIA REPAIR  2003   LUMBAR LAMINECTOMY/DECOMPRESSION MICRODISCECTOMY Right 10/04/2013   Procedure: HEMI LAMINECTOMY L4 - L5 ON THE RIGHT /MICRODISCECTOMY 1 LEVEL;  Surgeon: Tobi Bastos, MD;   Location: [facility] ORS;  Service: Orthopedics;  Laterality: Right;   SKIN CANCER EXCISION          Home Medications    Prior to Admission medications   Medication Sig Start Date End Date Taking? Authorizing Provider  allopurinol (ZYLOPRIM) 100 MG tablet Take 200 mg by mouth daily.   Yes [provider]  cholecalciferol (VITAMIN D) 1000 UNITS tablet Take 5,000 Units by mouth daily.    Yes [provider]  cycloSPORINE (RESTASIS) 0.05 % ophthalmic emulsion Place 1 drop into both eyes 2 (two) times daily.   Yes [provider]  Ibuprofen-Famotidine (DUEXIS) 800-26.6 MG TABS Take 1 tablet by mouth 2 (two) times daily.   Yes [provider]  LORazepam (ATIVAN) 1 MG tablet Take 0.5 mg by mouth daily as needed for anxiety.   Yes [provider]  Multiple Vitamins-Minerals (ALIVE MENS ENERGY PO) Take 1 tablet by mouth daily.   Yes [provider]  omeprazole (PRILOSEC) 20 MG capsule Take 20 mg by mouth daily.   Yes [provider]  rosuvastatin (CRESTOR) 10 MG tablet Take 10 mg by mouth 2 (two) times a week. Monday and Thursday   Yes [provider]  tamsulosin (FLOMAX) 0.4 MG CAPS capsule Take 0.4 mg by mouth daily.   Yes [provider]  triamterene-hydrochlorothiazide (DYAZIDE) 37.5-25 MG per capsule Take  1 capsule by mouth every morning.   Yes [provider]  ciprofloxacin (CIPRO) 500 MG tablet Take 1 tablet (500 mg total) by mouth every 12 (twelve) hours for 14 days. 01/25/19 02/08/19  Tacy Learn, PA-C  methocarbamol (ROBAXIN) 500 MG tablet Take 1 tablet (500 mg total) by mouth every 6 (six) hours as needed for muscle spasms. Patient not taking: Reported on 01/25/2019 10/05/13   Ardeen Jourdain, PA-C  ondansetron (ZOFRAN ODT) 4 MG disintegrating tablet Take 1 tablet (4 mg total) by mouth every 8 (eight) hours as needed for nausea or vomiting. 01/25/19   Tacy Learn, PA-C  oxyCODONE-acetaminophen  (PERCOCET/ROXICET) 5-325 MG tablet Take 1 tablet by mouth every 4 (four) hours as needed for severe pain. 01/25/19   Tacy Learn, PA-C    Family History No family history on file.  Social History Social History   Tobacco Use   Smoking status: Former Smoker    Quit date: 09/26/2003    Years since quitting: 15.3   Smokeless tobacco: Never Used  Substance Use Topics   Alcohol use: No   Drug use: No     Allergies   Sudafed [pseudoephedrine hcl]   Review of Systems Review of Systems  Constitutional: Positive for fever.  Respiratory: Negative for shortness of breath.   Cardiovascular: Negative for chest pain.  Gastrointestinal: Positive for abdominal pain, diarrhea, nausea and vomiting. Negative for constipation and rectal pain.  Genitourinary: Positive for dysuria, frequency, hematuria and testicular pain. Negative for difficulty urinating.  Musculoskeletal: Positive for back pain.  Skin: Negative for rash and wound.  Allergic/Immunologic: Negative for immunocompromised state.  Neurological: Negative for weakness.  Psychiatric/Behavioral: Negative for confusion.  All other systems reviewed and are negative.    Physical Exam Updated Vital Signs BP (!) 104/49    Pulse 100    Temp 99.7 F (37.6 C) (Oral)    Resp (!) 23    Ht 5\' 10"  (1.778 m)    Wt 93 kg    SpO2 94%    BMI 29.41 kg/m   Physical Exam Vitals signs and nursing note reviewed.  Constitutional:      General: He is not in acute distress.    Appearance: He is well-developed. He is not diaphoretic.  HENT:     Head: Normocephalic and atraumatic.  Cardiovascular:     Rate and Rhythm: Normal rate and regular rhythm.     Pulses: Normal pulses.     Heart sounds: Normal heart sounds.  Pulmonary:     Effort: Pulmonary effort is normal.     Breath sounds: Normal breath sounds.  Abdominal:     Palpations: Abdomen is soft.     Tenderness: There is generalized abdominal tenderness.     Comments: Mild  generalized abdominal tenderness  Skin:    General: Skin is warm and dry.     Findings: No erythema or rash.  Neurological:     Mental Status: He is alert and oriented to person, place, and time.  Psychiatric:        Behavior: Behavior normal.      ED Treatments / Results  Labs (all labs ordered are listed, but only abnormal results are displayed) Labs Reviewed  URINALYSIS, ROUTINE W REFLEX MICROSCOPIC - Abnormal; Notable for the following components:      Result Value   Color, Urine AMBER (*)    APPearance CLOUDY (*)    Hgb urine dipstick LARGE (*)    Protein, ur 100 (*)  Leukocytes,Ua LARGE (*)    RBC / HPF >50 (*)    Bacteria, UA RARE (*)    All other components within normal limits  COMPREHENSIVE METABOLIC PANEL - Abnormal; Notable for the following components:   Glucose, Bld 112 (*)    BUN 24 (*)    All other components within normal limits  CBC WITH DIFFERENTIAL/PLATELET - Abnormal; Notable for the following components:   WBC 11.7 (*)    Neutro Abs 11.1 (*)    Lymphs Abs 0.3 (*)    All other components within normal limits  URINE CULTURE  LACTIC ACID, PLASMA    EKG None  Radiology Ct Renal Stone Study  Result Date: 01/25/2019 CLINICAL DATA:  Flank pain with disease suspected. Urinary frequency with fever EXAM: CT ABDOMEN AND PELVIS WITHOUT CONTRAST TECHNIQUE: Multidetector CT imaging of the abdomen and pelvis was performed following the standard protocol without IV contrast. COMPARISON:  Abdominal CT report 04/30/2003 FINDINGS: Lower chest:  Mild atelectasis at the bases. Hepatobiliary: No focal liver abnormality.No evidence of biliary obstruction or stone. Pancreas: Unremarkable. Spleen: Unremarkable. Adrenals/Urinary Tract: Negative adrenals. No hydronephrosis or stone. Unremarkable bladder. Stomach/Bowel: Sigmoidectomy. There is history of diverticulitis. Appendectomy. No evidence of bowel obstruction or inflammation. Vascular/Lymphatic: No acute vascular  abnormality. Atherosclerotic calcification. No mass or adenopathy. Reproductive:Enlarged prostate with regional fat haziness which also encompasses the left seminal vesicle. Prostate measures 6.5 cm in transverse span. Vasectomy clip seen on the left. Other: No ascites or pneumoperitoneum. Right inguinal hernia repair. Musculoskeletal: No acute abnormalities. Lower lumbar degenerative disease IMPRESSION: 1. Fat stranding around the prostate and seminal vesicles, likely prostatitis. Prostate is symmetrically enlarged. 2. No hydronephrosis or urolithiasis. 3.  Aortic Atherosclerosis (ICD10-I70.0), mild. Electronically Signed   By: Monte Fantasia M.D.   On: 01/25/2019 06:39    Procedures Procedures (including critical care time)  Medications Ordered in ED Medications  acetaminophen (TYLENOL) tablet 650 mg (650 mg Oral Given 01/25/19 0540)  ondansetron (ZOFRAN) injection 4 mg (4 mg Intravenous Given 01/25/19 0610)  cefTRIAXone (ROCEPHIN) 1 g in sodium chloride 0.9 % 100 mL IVPB (0 g Intravenous Stopped 01/25/19 0738)  sodium chloride 0.9 % bolus 1,000 mL (0 mLs Intravenous Stopped 01/25/19 0846)  acetaminophen (TYLENOL) tablet 325 mg (325 mg Oral Given 01/25/19 0746)  cefTRIAXone (ROCEPHIN) 1 g in sodium chloride 0.9 % 100 mL IVPB (0 g Intravenous Stopped 01/25/19 0922)  oxyCODONE-acetaminophen (PERCOCET/ROXICET) 5-325 MG per tablet 1 tablet (1 tablet Oral Given 01/25/19 1010)     Initial Impression / Assessment and Plan / ED Course  I have reviewed the triage vital signs and the nursing notes.  Pertinent labs & imaging results that were available during my care of the patient were reviewed by me and considered in my medical decision making (see chart for details).  Clinical Course as of Jan 24 1013  Wed Jan 25, 3748  8140 68 year old male with urinary symptoms fever, vomiting.  History of prostatitis and feels similar.  On exam patient has mild abdominal tenderness. CT shows acute  prostatitis.  CBC with mild leukocytosis at 11.7, CMP without significant findings, lactic acid normal at 1.5.  Urinalysis with large hemoglobin, protein, leukocytes, red blood cells and white blood cells with rare bacteria.  Patient was given 2 g of Rocephin with a liter of saline in the ER.  Also given Zofran and Tylenol.  Pain has improved, fever has improved.  Patient is tolerating p.o.'s and is ready for discharge with plan to see urology  tomorrow.  Patient given strict return to ER precautions.  Patient discharged with Cipro and Zofran. Case discussed with Dr. Ronnald Nian, ER attending, agrees with plan of care.   [LM]  1012 Patient was ready for discharge, ambulated to the bathroom and reported not feeling well, requests to see his urologist in the ER today.  Consult to Dr. Matilde Sprang with urology reviewed case, patient may follow-up tomorrow with Dr. Gloriann Loan as planned, agrees with appropriate care provider in the ER including IV antibiotics, discharged home on Cipro.  Percocet prescription sent to pharmacy after review of database to take for pain.   [LM]    Clinical Course User Index [LM] Tacy Learn, PA-C      Final Clinical Impressions(s) / ED Diagnoses   Final diagnoses:  Acute prostatitis    ED Discharge Orders         Ordered    ciprofloxacin (CIPRO) 500 MG tablet  Every 12 hours     01/25/19 0937    ondansetron (ZOFRAN ODT) 4 MG disintegrating tablet  Every 8 hours PRN     01/25/19 0937    oxyCODONE-acetaminophen (PERCOCET/ROXICET) 5-325 MG tablet  Every 4 hours PRN     01/25/19 1012           Tacy Learn, PA-C 01/25/19 0940    Tacy Learn, PA-C 01/25/19 [place], [place], DO 01/25/19 1239

## 2019-01-27 LAB — URINE CULTURE: Culture: >=100000 — AB

## 2019-01-28 ENCOUNTER — Telehealth: Payer: Self-pay

## 2019-01-28 NOTE — Telephone Encounter (Signed)
Post ED Visit - Positive Culture Follow-up  Culture report reviewed by antimicrobial stewardship pharmacist: [facility] []  Elenor Quinones, Pharm.D. []  Heide Guile, Pharm.D., BCPS AQ-ID []  Parks Neptune, Pharm.D., BCPS []  Alycia Rossetti, Pharm.D., BCPS []  [place], Pharm.D., BCPS, AAHIVP []  Legrand Como, Pharm.D., BCPS, AAHIVP []  Salome Arnt, PharmD, BCPS []  Johnnette Gourd, PharmD, BCPS []  Hughes Better, PharmD, BCPS []  Leeroy Cha, PharmD []  Laqueta Linden, PharmD, BCPS []  Albertina Parr, PharmD  [facility] []  Leodis Sias, PharmD []  Lindell Spar, PharmD []  Royetta Asal, PharmD []  Graylin Shiver, Rph []  Rema Fendt) Glennon Mac, PharmD []  Arlyn Dunning, PharmD []  Netta Cedars, PharmD []  Dia Sitter, PharmD []  Leone Haven, PharmD []  Gretta Arab, PharmD []  Theodis Shove, PharmD []  Peggyann Juba, PharmD [x]  Reuel Boom, PharmD   Positive urine culture Treated with Cipro, organism sensitive to the same and no further patient follow-up is required at this time.  Genia Del 01/28/2019, 1:09 PM

## 2020-11-18 IMAGING — CT CT RENAL STONE PROTOCOL
2 of 4 series · 16 of 46 positions shown, 18 images · non-contrast
Comparison: Abdominal CT report 04/30/2003

CLINICAL DATA: Flank pain with disease suspected. Urinary frequency
with fever

EXAM:
CT ABDOMEN AND PELVIS WITHOUT CONTRAST
TECHNIQUE: Multidetector CT imaging of the abdomen and pelvis was performed
following the standard protocol without IV contrast.

[Series 2: axial st · axial · 0.76mm/px · z∈[+1118,+1518]mm · 13 of 90 slices shown, 15 images]
[im 5/90  soft-tissue]
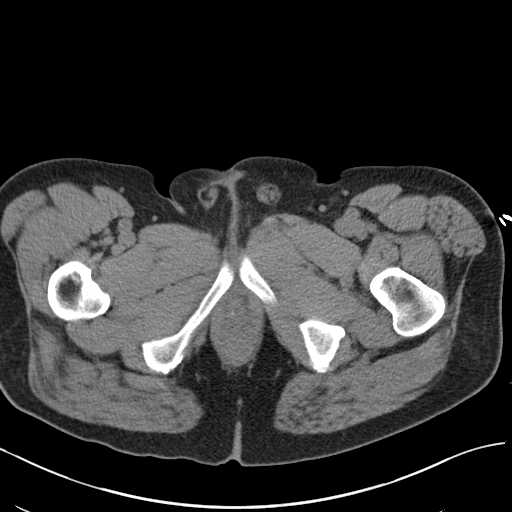
[im 5/90  bone]
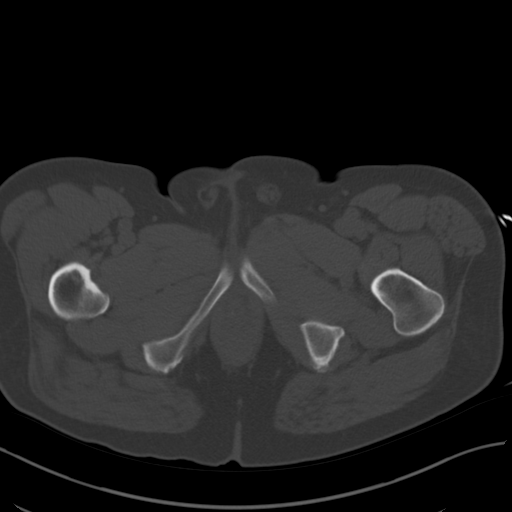
[im 15/90  soft-tissue]
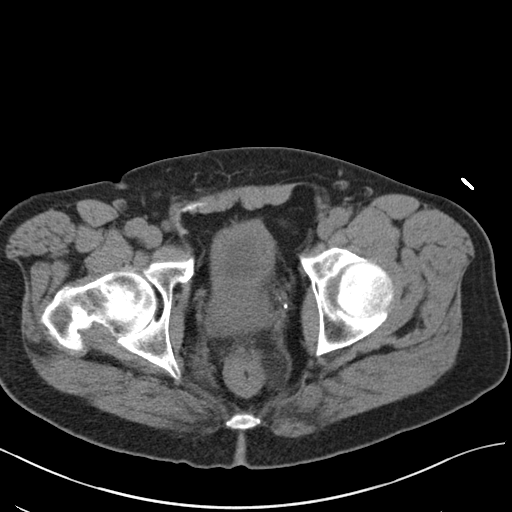
[im 19/90  soft-tissue]
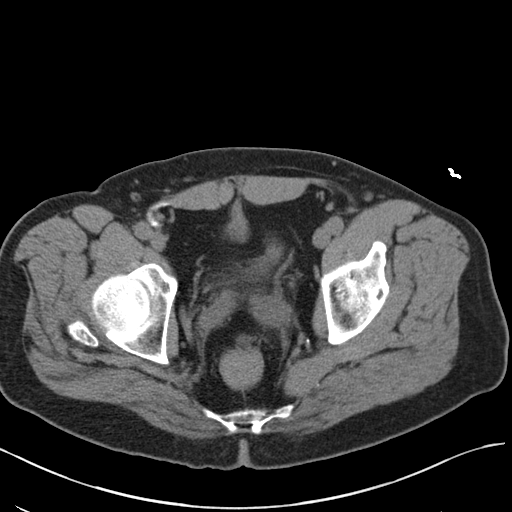
[im 24/90  soft-tissue]
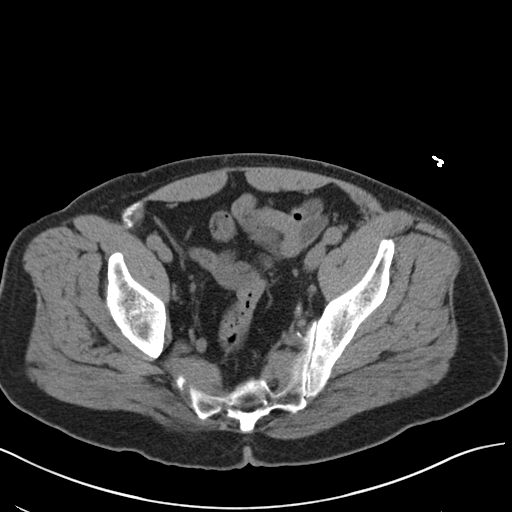
[im 33/90  soft-tissue]
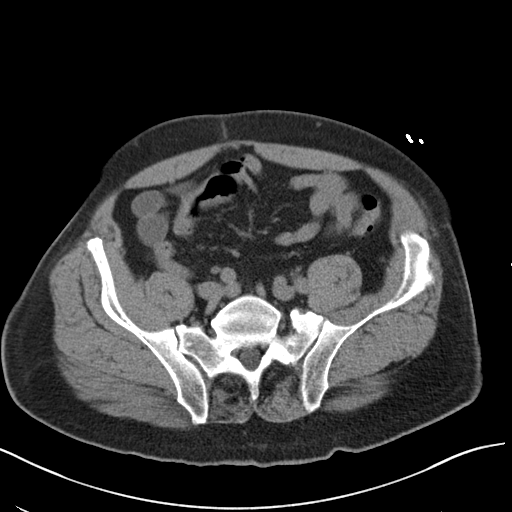
[im 38/90  soft-tissue]
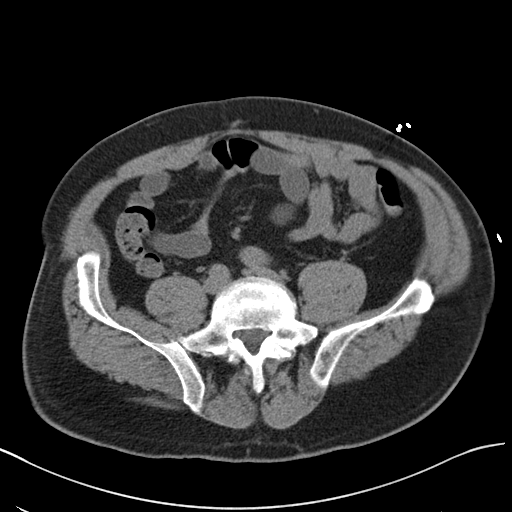
[im 47/90  soft-tissue]
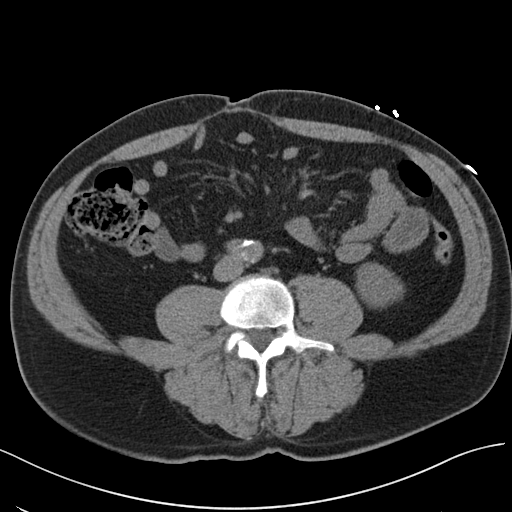
[im 52/90  soft-tissue]
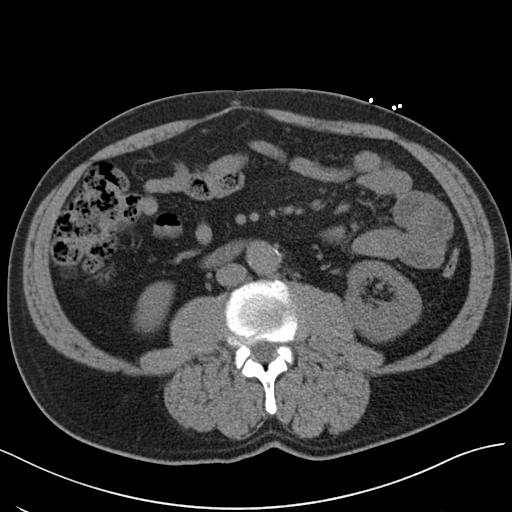
[im 57/90  soft-tissue]
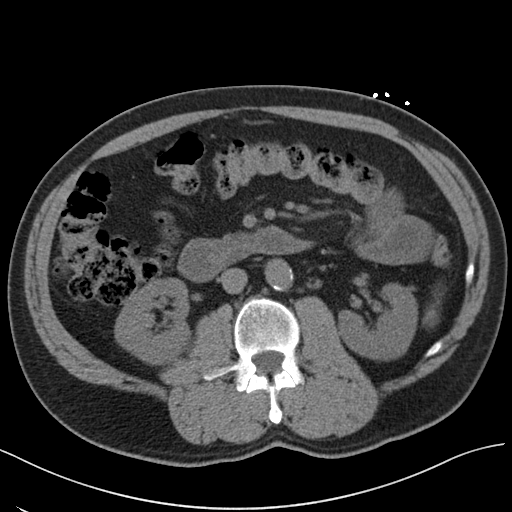
[im 57/90  bone]
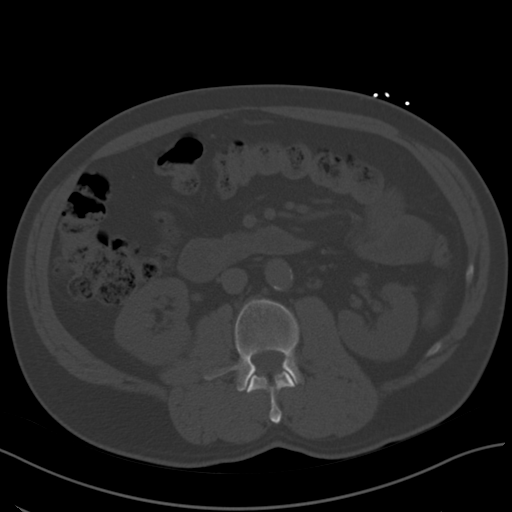
[im 66/90  soft-tissue]
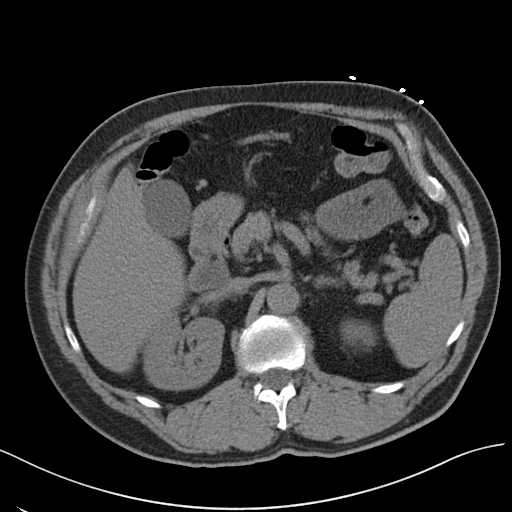
[im 71/90  soft-tissue]
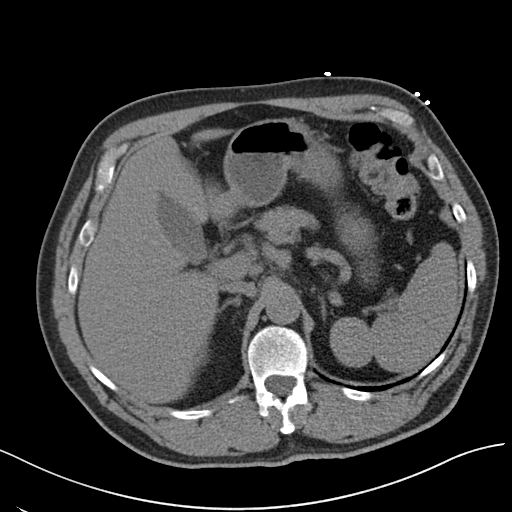
[im 75/90  soft-tissue]
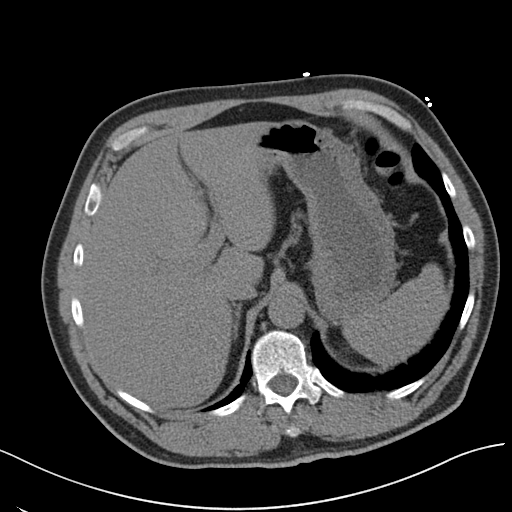
[im 85/90  soft-tissue]
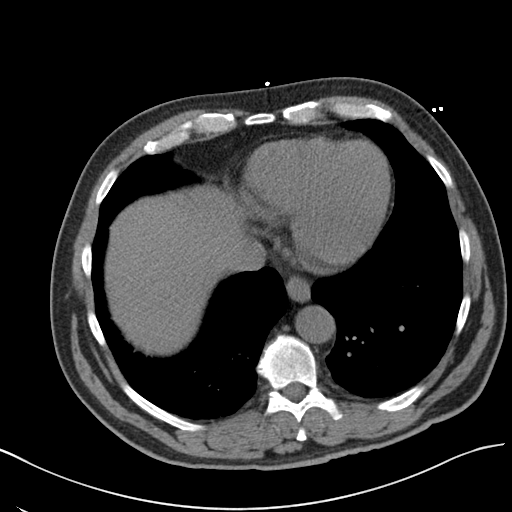

[Series 5: coronal · coronal · 0.76mm/px · 3 of 151 slices shown]
[im 51/151  soft-tissue]
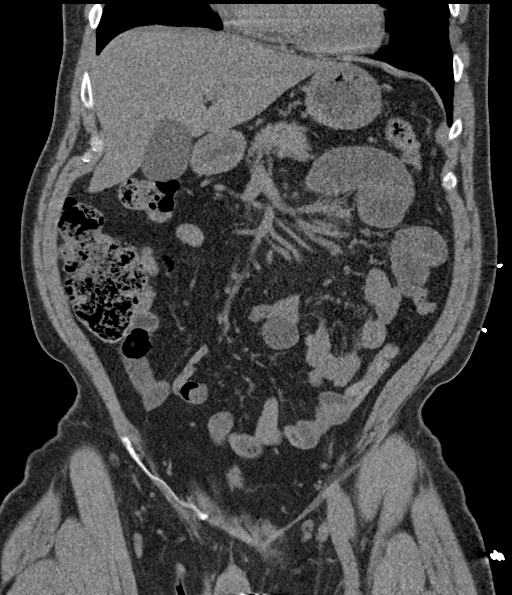
[im 67/151  soft-tissue]
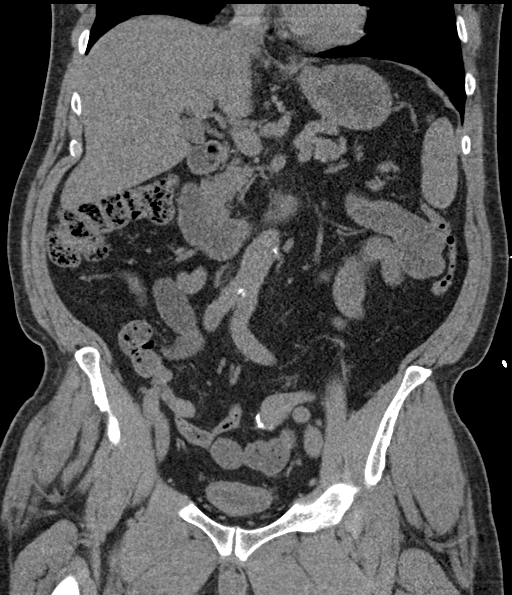
[im 84/151  soft-tissue]
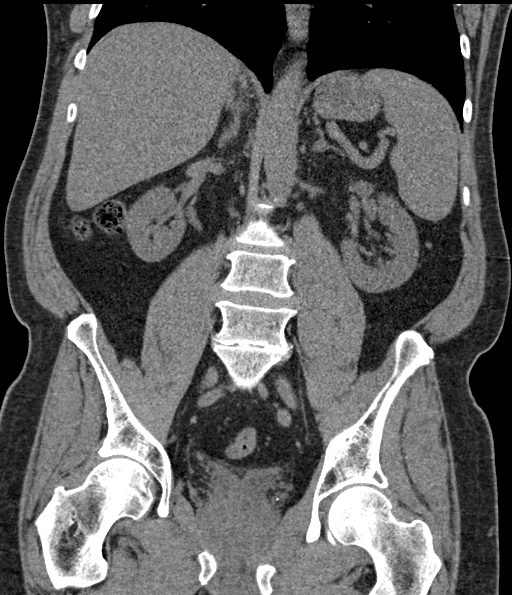

[16 of 46 positions shown; findings below may reference images not displayed]

FINDINGS: Lower chest:  Mild atelectasis at the bases.

Hepatobiliary: No focal liver abnormality.No evidence of biliary
obstruction or stone.

Pancreas: Unremarkable.

Spleen: Unremarkable.

Adrenals/Urinary Tract: Negative adrenals. No hydronephrosis or
stone. Unremarkable bladder.

Stomach/Bowel: Sigmoidectomy. There is history of diverticulitis.
Appendectomy. No evidence of bowel obstruction or inflammation.

Vascular/Lymphatic: No acute vascular abnormality. Atherosclerotic
calcification. No mass or adenopathy.

Reproductive:Enlarged prostate with regional fat haziness which also
encompasses the left seminal vesicle. Prostate measures 6.5 cm in
transverse span. Vasectomy clip seen on the left.

Other: No ascites or pneumoperitoneum. Right inguinal hernia repair.

Musculoskeletal: No acute abnormalities. Lower lumbar degenerative
disease
IMPRESSION: 1. Fat stranding around the prostate and seminal vesicles, likely
prostatitis. Prostate is symmetrically enlarged.
2. No hydronephrosis or urolithiasis.
3.  Aortic Atherosclerosis ([id number]), mild.
